# Patient Record
Sex: Female | Born: 1945 | Race: Black or African American | Hispanic: No | Marital: Single | Smoking: Current every day smoker
Health system: Southern US, Community
[De-identification: ages and names within clinical notes are randomized; demographics above are authoritative.]

## PROBLEM LIST (undated history)

## (undated) DIAGNOSIS — D649 Anemia, unspecified: Secondary | ICD-10-CM

## (undated) DIAGNOSIS — E119 Type 2 diabetes mellitus without complications: Secondary | ICD-10-CM

## (undated) DIAGNOSIS — I10 Essential (primary) hypertension: Secondary | ICD-10-CM

## (undated) HISTORY — PX: BACK SURGERY: SHX140

---

## 1998-02-17 ENCOUNTER — Emergency Department (HOSPITAL_COMMUNITY): Admission: EM | Admit: 1998-02-17 | Discharge: 1998-02-17 | Payer: Self-pay | Admitting: Emergency Medicine

## 1998-02-17 ENCOUNTER — Encounter: Payer: Self-pay | Admitting: Internal Medicine

## 1998-02-17 ENCOUNTER — Encounter: Payer: Self-pay | Admitting: Emergency Medicine

## 1998-02-18 ENCOUNTER — Emergency Department (HOSPITAL_COMMUNITY): Admission: EM | Admit: 1998-02-18 | Discharge: 1998-02-18 | Payer: Self-pay | Admitting: Emergency Medicine

## 1998-02-18 ENCOUNTER — Encounter: Payer: Self-pay | Admitting: Emergency Medicine

## 1998-02-23 ENCOUNTER — Encounter: Payer: Self-pay | Admitting: Cardiovascular Disease

## 1998-02-23 ENCOUNTER — Inpatient Hospital Stay (HOSPITAL_COMMUNITY): Admission: EM | Admit: 1998-02-23 | Discharge: 1998-02-25 | Payer: Self-pay | Admitting: Emergency Medicine

## 1998-06-24 ENCOUNTER — Emergency Department (HOSPITAL_COMMUNITY): Admission: EM | Admit: 1998-06-24 | Discharge: 1998-06-24 | Payer: Self-pay | Admitting: Emergency Medicine

## 1998-09-01 ENCOUNTER — Emergency Department (HOSPITAL_COMMUNITY): Admission: EM | Admit: 1998-09-01 | Discharge: 1998-09-01 | Payer: Self-pay | Admitting: Emergency Medicine

## 1998-10-24 ENCOUNTER — Encounter: Payer: Self-pay | Admitting: Emergency Medicine

## 1998-10-24 ENCOUNTER — Emergency Department (HOSPITAL_COMMUNITY): Admission: EM | Admit: 1998-10-24 | Discharge: 1998-10-24 | Payer: Self-pay | Admitting: Emergency Medicine

## 1998-12-25 ENCOUNTER — Encounter: Payer: Self-pay | Admitting: Emergency Medicine

## 1998-12-25 ENCOUNTER — Inpatient Hospital Stay (HOSPITAL_COMMUNITY): Admission: EM | Admit: 1998-12-25 | Discharge: 1998-12-28 | Payer: Self-pay | Admitting: Emergency Medicine

## 1999-11-14 ENCOUNTER — Emergency Department (HOSPITAL_COMMUNITY): Admission: EM | Admit: 1999-11-14 | Discharge: 1999-11-14 | Payer: Self-pay | Admitting: Emergency Medicine

## 1999-12-12 ENCOUNTER — Encounter: Admission: RE | Admit: 1999-12-12 | Discharge: 1999-12-12 | Payer: Self-pay | Admitting: Hematology and Oncology

## 1999-12-29 ENCOUNTER — Encounter: Admission: RE | Admit: 1999-12-29 | Discharge: 2000-03-28 | Payer: Self-pay | Admitting: Internal Medicine

## 2000-02-16 ENCOUNTER — Encounter: Admission: RE | Admit: 2000-02-16 | Discharge: 2000-02-16 | Payer: Self-pay

## 2000-02-17 ENCOUNTER — Emergency Department (HOSPITAL_COMMUNITY): Admission: EM | Admit: 2000-02-17 | Discharge: 2000-02-17 | Payer: Self-pay | Admitting: *Deleted

## 2000-03-04 ENCOUNTER — Emergency Department (HOSPITAL_COMMUNITY): Admission: EM | Admit: 2000-03-04 | Discharge: 2000-03-04 | Payer: Self-pay | Admitting: Emergency Medicine

## 2000-03-05 ENCOUNTER — Ambulatory Visit (HOSPITAL_COMMUNITY): Admission: RE | Admit: 2000-03-05 | Discharge: 2000-03-05 | Payer: Self-pay | Admitting: Obstetrics

## 2000-04-19 ENCOUNTER — Encounter: Admission: RE | Admit: 2000-04-19 | Discharge: 2000-04-19 | Payer: Self-pay

## 2000-04-23 ENCOUNTER — Encounter: Payer: Self-pay | Admitting: Internal Medicine

## 2000-04-23 ENCOUNTER — Encounter: Payer: Self-pay | Admitting: Emergency Medicine

## 2000-04-23 ENCOUNTER — Inpatient Hospital Stay (HOSPITAL_COMMUNITY): Admission: EM | Admit: 2000-04-23 | Discharge: 2000-04-24 | Payer: Self-pay | Admitting: Emergency Medicine

## 2000-05-08 ENCOUNTER — Encounter: Admission: RE | Admit: 2000-05-08 | Discharge: 2000-05-08 | Payer: Self-pay

## 2000-05-31 ENCOUNTER — Emergency Department (HOSPITAL_COMMUNITY): Admission: EM | Admit: 2000-05-31 | Discharge: 2000-05-31 | Payer: Self-pay | Admitting: Emergency Medicine

## 2000-06-04 ENCOUNTER — Emergency Department (HOSPITAL_COMMUNITY): Admission: EM | Admit: 2000-06-04 | Discharge: 2000-06-04 | Payer: Self-pay | Admitting: Emergency Medicine

## 2000-06-07 ENCOUNTER — Ambulatory Visit (HOSPITAL_COMMUNITY): Admission: RE | Admit: 2000-06-07 | Discharge: 2000-06-07 | Payer: Self-pay | Admitting: Emergency Medicine

## 2000-06-08 ENCOUNTER — Emergency Department (HOSPITAL_COMMUNITY): Admission: EM | Admit: 2000-06-08 | Discharge: 2000-06-08 | Payer: Self-pay | Admitting: Emergency Medicine

## 2000-06-11 ENCOUNTER — Emergency Department (HOSPITAL_COMMUNITY): Admission: EM | Admit: 2000-06-11 | Discharge: 2000-06-11 | Payer: Self-pay | Admitting: Emergency Medicine

## 2000-06-11 ENCOUNTER — Encounter: Admission: RE | Admit: 2000-06-11 | Discharge: 2000-06-11 | Payer: Self-pay | Admitting: Internal Medicine

## 2000-06-21 ENCOUNTER — Encounter: Admission: RE | Admit: 2000-06-21 | Discharge: 2000-06-21 | Payer: Self-pay | Admitting: Internal Medicine

## 2000-06-22 ENCOUNTER — Ambulatory Visit (HOSPITAL_COMMUNITY): Admission: RE | Admit: 2000-06-22 | Discharge: 2000-06-22 | Payer: Self-pay | Admitting: *Deleted

## 2000-06-25 ENCOUNTER — Encounter: Admission: RE | Admit: 2000-06-25 | Discharge: 2000-06-25 | Payer: Self-pay | Admitting: Internal Medicine

## 2000-07-19 ENCOUNTER — Encounter: Admission: RE | Admit: 2000-07-19 | Discharge: 2000-07-19 | Payer: Self-pay | Admitting: Internal Medicine

## 2000-10-22 ENCOUNTER — Encounter: Admission: RE | Admit: 2000-10-22 | Discharge: 2000-10-22 | Payer: Self-pay | Admitting: Internal Medicine

## 2000-11-08 ENCOUNTER — Emergency Department (HOSPITAL_COMMUNITY): Admission: EM | Admit: 2000-11-08 | Discharge: 2000-11-08 | Payer: Self-pay | Admitting: Emergency Medicine

## 2000-11-08 ENCOUNTER — Encounter: Payer: Self-pay | Admitting: Emergency Medicine

## 2000-12-24 ENCOUNTER — Encounter: Admission: RE | Admit: 2000-12-24 | Discharge: 2000-12-24 | Payer: Self-pay | Admitting: Internal Medicine

## 2001-03-10 ENCOUNTER — Ambulatory Visit (HOSPITAL_COMMUNITY): Admission: RE | Admit: 2001-03-10 | Discharge: 2001-03-10 | Payer: Self-pay | Admitting: Obstetrics

## 2001-03-10 ENCOUNTER — Encounter: Payer: Self-pay | Admitting: Obstetrics

## 2001-07-17 ENCOUNTER — Encounter: Payer: Self-pay | Admitting: Emergency Medicine

## 2001-07-17 ENCOUNTER — Emergency Department (HOSPITAL_COMMUNITY): Admission: EM | Admit: 2001-07-17 | Discharge: 2001-07-17 | Payer: Self-pay

## 2001-08-05 ENCOUNTER — Emergency Department (HOSPITAL_COMMUNITY): Admission: EM | Admit: 2001-08-05 | Discharge: 2001-08-05 | Payer: Self-pay | Admitting: Emergency Medicine

## 2001-08-26 ENCOUNTER — Encounter: Admission: RE | Admit: 2001-08-26 | Discharge: 2001-08-26 | Payer: Self-pay | Admitting: Internal Medicine

## 2001-09-09 ENCOUNTER — Encounter: Admission: RE | Admit: 2001-09-09 | Discharge: 2001-09-09 | Payer: Self-pay | Admitting: Internal Medicine

## 2001-09-15 ENCOUNTER — Encounter: Admission: RE | Admit: 2001-09-15 | Discharge: 2001-09-15 | Payer: Self-pay | Admitting: Internal Medicine

## 2001-10-15 ENCOUNTER — Encounter: Admission: RE | Admit: 2001-10-15 | Discharge: 2001-10-15 | Payer: Self-pay | Admitting: Internal Medicine

## 2001-10-17 ENCOUNTER — Emergency Department (HOSPITAL_COMMUNITY): Admission: EM | Admit: 2001-10-17 | Discharge: 2001-10-17 | Payer: Self-pay | Admitting: Emergency Medicine

## 2001-11-14 ENCOUNTER — Encounter: Admission: RE | Admit: 2001-11-14 | Discharge: 2001-11-14 | Payer: Self-pay | Admitting: Internal Medicine

## 2001-12-23 ENCOUNTER — Ambulatory Visit (HOSPITAL_COMMUNITY): Admission: RE | Admit: 2001-12-23 | Discharge: 2001-12-23 | Payer: Self-pay | Admitting: Internal Medicine

## 2001-12-25 ENCOUNTER — Emergency Department (HOSPITAL_COMMUNITY): Admission: EM | Admit: 2001-12-25 | Discharge: 2001-12-25 | Payer: Self-pay | Admitting: Emergency Medicine

## 2001-12-25 ENCOUNTER — Encounter: Payer: Self-pay | Admitting: Emergency Medicine

## 2001-12-26 ENCOUNTER — Ambulatory Visit (HOSPITAL_COMMUNITY): Admission: RE | Admit: 2001-12-26 | Discharge: 2001-12-26 | Payer: Self-pay | Admitting: Physician Assistant

## 2002-03-03 ENCOUNTER — Emergency Department (HOSPITAL_COMMUNITY): Admission: EM | Admit: 2002-03-03 | Discharge: 2002-03-03 | Payer: Self-pay | Admitting: Emergency Medicine

## 2002-03-06 ENCOUNTER — Emergency Department (HOSPITAL_COMMUNITY): Admission: EM | Admit: 2002-03-06 | Discharge: 2002-03-06 | Payer: Self-pay | Admitting: Emergency Medicine

## 2002-03-11 ENCOUNTER — Encounter: Payer: Self-pay | Admitting: Obstetrics

## 2002-03-11 ENCOUNTER — Ambulatory Visit (HOSPITAL_COMMUNITY): Admission: RE | Admit: 2002-03-11 | Discharge: 2002-03-11 | Payer: Self-pay | Admitting: Obstetrics

## 2002-03-24 ENCOUNTER — Encounter: Admission: RE | Admit: 2002-03-24 | Discharge: 2002-03-24 | Payer: Self-pay | Admitting: Internal Medicine

## 2002-04-06 ENCOUNTER — Emergency Department (HOSPITAL_COMMUNITY): Admission: EM | Admit: 2002-04-06 | Discharge: 2002-04-06 | Payer: Self-pay

## 2002-06-10 ENCOUNTER — Emergency Department (HOSPITAL_COMMUNITY): Admission: EM | Admit: 2002-06-10 | Discharge: 2002-06-10 | Payer: Self-pay | Admitting: Emergency Medicine

## 2002-07-03 ENCOUNTER — Encounter: Admission: RE | Admit: 2002-07-03 | Discharge: 2002-07-03 | Payer: Self-pay | Admitting: Internal Medicine

## 2002-07-03 ENCOUNTER — Ambulatory Visit (HOSPITAL_COMMUNITY): Admission: RE | Admit: 2002-07-03 | Discharge: 2002-07-03 | Payer: Self-pay | Admitting: Internal Medicine

## 2002-07-25 ENCOUNTER — Encounter: Payer: Self-pay | Admitting: Emergency Medicine

## 2002-07-25 ENCOUNTER — Emergency Department (HOSPITAL_COMMUNITY): Admission: EM | Admit: 2002-07-25 | Discharge: 2002-07-25 | Payer: Self-pay | Admitting: Emergency Medicine

## 2002-08-07 ENCOUNTER — Encounter: Payer: Self-pay | Admitting: Emergency Medicine

## 2002-08-07 ENCOUNTER — Emergency Department (HOSPITAL_COMMUNITY): Admission: EM | Admit: 2002-08-07 | Discharge: 2002-08-07 | Payer: Self-pay | Admitting: Emergency Medicine

## 2002-10-15 ENCOUNTER — Encounter: Admission: RE | Admit: 2002-10-15 | Discharge: 2002-10-15 | Payer: Self-pay | Admitting: Internal Medicine

## 2002-11-02 ENCOUNTER — Emergency Department (HOSPITAL_COMMUNITY): Admission: AD | Admit: 2002-11-02 | Discharge: 2002-11-02 | Payer: Self-pay | Admitting: Family Medicine

## 2002-11-04 ENCOUNTER — Emergency Department (HOSPITAL_COMMUNITY): Admission: AD | Admit: 2002-11-04 | Discharge: 2002-11-04 | Payer: Self-pay | Admitting: Family Medicine

## 2002-11-25 ENCOUNTER — Encounter: Admission: RE | Admit: 2002-11-25 | Discharge: 2002-11-25 | Payer: Self-pay | Admitting: Internal Medicine

## 2003-01-13 ENCOUNTER — Emergency Department (HOSPITAL_COMMUNITY): Admission: EM | Admit: 2003-01-13 | Discharge: 2003-01-14 | Payer: Self-pay | Admitting: Emergency Medicine

## 2003-02-19 ENCOUNTER — Emergency Department (HOSPITAL_COMMUNITY): Admission: EM | Admit: 2003-02-19 | Discharge: 2003-02-19 | Payer: Self-pay | Admitting: Family Medicine

## 2003-02-24 ENCOUNTER — Encounter: Admission: RE | Admit: 2003-02-24 | Discharge: 2003-02-24 | Payer: Self-pay | Admitting: Internal Medicine

## 2003-03-03 ENCOUNTER — Encounter (INDEPENDENT_AMBULATORY_CARE_PROVIDER_SITE_OTHER): Payer: Self-pay | Admitting: *Deleted

## 2003-03-03 ENCOUNTER — Encounter: Admission: RE | Admit: 2003-03-03 | Discharge: 2003-03-03 | Payer: Self-pay | Admitting: Internal Medicine

## 2003-03-03 LAB — CONVERTED CEMR LAB: Pap Smear: NORMAL

## 2003-03-14 ENCOUNTER — Encounter (INDEPENDENT_AMBULATORY_CARE_PROVIDER_SITE_OTHER): Payer: Self-pay | Admitting: *Deleted

## 2003-03-18 ENCOUNTER — Encounter: Admission: RE | Admit: 2003-03-18 | Discharge: 2003-03-18 | Payer: Self-pay | Admitting: Internal Medicine

## 2003-03-24 ENCOUNTER — Ambulatory Visit (HOSPITAL_COMMUNITY): Admission: RE | Admit: 2003-03-24 | Discharge: 2003-03-24 | Payer: Self-pay | Admitting: Internal Medicine

## 2003-03-29 ENCOUNTER — Emergency Department (HOSPITAL_COMMUNITY): Admission: EM | Admit: 2003-03-29 | Discharge: 2003-03-29 | Payer: Self-pay | Admitting: Emergency Medicine

## 2003-04-18 ENCOUNTER — Emergency Department (HOSPITAL_COMMUNITY): Admission: EM | Admit: 2003-04-18 | Discharge: 2003-04-19 | Payer: Self-pay | Admitting: Emergency Medicine

## 2003-04-19 ENCOUNTER — Emergency Department (HOSPITAL_COMMUNITY): Admission: AD | Admit: 2003-04-19 | Discharge: 2003-04-19 | Payer: Self-pay | Admitting: Family Medicine

## 2003-06-25 ENCOUNTER — Encounter (INDEPENDENT_AMBULATORY_CARE_PROVIDER_SITE_OTHER): Payer: Self-pay | Admitting: *Deleted

## 2003-06-25 ENCOUNTER — Encounter: Admission: RE | Admit: 2003-06-25 | Discharge: 2003-06-25 | Payer: Self-pay | Admitting: Internal Medicine

## 2003-06-25 LAB — CONVERTED CEMR LAB
Hemoglobin: 11.8 g/dL
Platelets: 671 K/uL
WBC: 8.5 10*3/microliter

## 2003-09-21 ENCOUNTER — Other Ambulatory Visit: Admission: RE | Admit: 2003-09-21 | Discharge: 2003-09-21 | Payer: Self-pay | Admitting: Oncology

## 2003-12-06 ENCOUNTER — Ambulatory Visit: Payer: Self-pay | Admitting: Internal Medicine

## 2003-12-11 ENCOUNTER — Emergency Department (HOSPITAL_COMMUNITY): Admission: EM | Admit: 2003-12-11 | Discharge: 2003-12-11 | Payer: Self-pay | Admitting: Emergency Medicine

## 2003-12-29 ENCOUNTER — Ambulatory Visit (HOSPITAL_COMMUNITY): Admission: RE | Admit: 2003-12-29 | Discharge: 2003-12-29 | Payer: Self-pay | Admitting: Internal Medicine

## 2003-12-29 ENCOUNTER — Encounter (INDEPENDENT_AMBULATORY_CARE_PROVIDER_SITE_OTHER): Payer: Self-pay | Admitting: *Deleted

## 2004-01-31 ENCOUNTER — Ambulatory Visit: Payer: Self-pay | Admitting: Internal Medicine

## 2004-02-02 ENCOUNTER — Encounter (INDEPENDENT_AMBULATORY_CARE_PROVIDER_SITE_OTHER): Payer: Self-pay | Admitting: *Deleted

## 2004-02-21 ENCOUNTER — Emergency Department (HOSPITAL_COMMUNITY): Admission: EM | Admit: 2004-02-21 | Discharge: 2004-02-22 | Payer: Self-pay | Admitting: Emergency Medicine

## 2004-04-14 ENCOUNTER — Emergency Department (HOSPITAL_COMMUNITY): Admission: EM | Admit: 2004-04-14 | Discharge: 2004-04-14 | Payer: Self-pay | Admitting: Emergency Medicine

## 2004-06-09 ENCOUNTER — Encounter (INDEPENDENT_AMBULATORY_CARE_PROVIDER_SITE_OTHER): Payer: Self-pay | Admitting: *Deleted

## 2004-06-09 ENCOUNTER — Ambulatory Visit: Payer: Self-pay | Admitting: Internal Medicine

## 2004-06-09 LAB — CONVERTED CEMR LAB
Hemoglobin: 11.2 g/dL
Platelets: 646 10*3/uL
WBC: 9 10*3/uL

## 2004-06-16 ENCOUNTER — Ambulatory Visit: Payer: Self-pay | Admitting: Internal Medicine

## 2004-06-21 ENCOUNTER — Emergency Department (HOSPITAL_COMMUNITY): Admission: EM | Admit: 2004-06-21 | Discharge: 2004-06-22 | Payer: Self-pay | Admitting: Emergency Medicine

## 2004-07-28 ENCOUNTER — Ambulatory Visit: Payer: Self-pay | Admitting: Internal Medicine

## 2004-08-01 ENCOUNTER — Emergency Department (HOSPITAL_COMMUNITY): Admission: EM | Admit: 2004-08-01 | Discharge: 2004-08-01 | Payer: Self-pay | Admitting: Emergency Medicine

## 2004-08-23 ENCOUNTER — Encounter (INDEPENDENT_AMBULATORY_CARE_PROVIDER_SITE_OTHER): Payer: Self-pay | Admitting: *Deleted

## 2004-08-23 ENCOUNTER — Ambulatory Visit: Payer: Self-pay | Admitting: Internal Medicine

## 2004-08-23 LAB — CONVERTED CEMR LAB
Hemoglobin: 11.5 g/dL
Platelets: 675 10*3/uL
WBC: 8.5 10*3/uL

## 2004-09-05 ENCOUNTER — Ambulatory Visit: Payer: Self-pay | Admitting: Physical Medicine and Rehabilitation

## 2004-09-05 ENCOUNTER — Encounter
Admission: RE | Admit: 2004-09-05 | Discharge: 2004-12-04 | Payer: Self-pay | Admitting: Physical Medicine and Rehabilitation

## 2004-09-26 ENCOUNTER — Emergency Department (HOSPITAL_COMMUNITY): Admission: EM | Admit: 2004-09-26 | Discharge: 2004-09-27 | Payer: Self-pay | Admitting: *Deleted

## 2004-11-03 ENCOUNTER — Emergency Department (HOSPITAL_COMMUNITY): Admission: EM | Admit: 2004-11-03 | Discharge: 2004-11-03 | Payer: Self-pay | Admitting: Emergency Medicine

## 2005-01-05 ENCOUNTER — Emergency Department (HOSPITAL_COMMUNITY): Admission: EM | Admit: 2005-01-05 | Discharge: 2005-01-05 | Payer: Self-pay | Admitting: Emergency Medicine

## 2005-03-20 ENCOUNTER — Emergency Department (HOSPITAL_COMMUNITY): Admission: EM | Admit: 2005-03-20 | Discharge: 2005-03-20 | Payer: Self-pay | Admitting: Emergency Medicine

## 2005-11-13 ENCOUNTER — Encounter (INDEPENDENT_AMBULATORY_CARE_PROVIDER_SITE_OTHER): Payer: Self-pay | Admitting: *Deleted

## 2005-11-13 DIAGNOSIS — N951 Menopausal and female climacteric states: Secondary | ICD-10-CM | POA: Insufficient documentation

## 2005-11-13 DIAGNOSIS — Z9189 Other specified personal risk factors, not elsewhere classified: Secondary | ICD-10-CM | POA: Insufficient documentation

## 2005-11-13 DIAGNOSIS — D759 Disease of blood and blood-forming organs, unspecified: Secondary | ICD-10-CM | POA: Insufficient documentation

## 2005-11-13 DIAGNOSIS — F17200 Nicotine dependence, unspecified, uncomplicated: Secondary | ICD-10-CM | POA: Insufficient documentation

## 2005-11-13 DIAGNOSIS — I509 Heart failure, unspecified: Secondary | ICD-10-CM | POA: Insufficient documentation

## 2005-11-13 DIAGNOSIS — D649 Anemia, unspecified: Secondary | ICD-10-CM | POA: Insufficient documentation

## 2005-11-13 DIAGNOSIS — G5600 Carpal tunnel syndrome, unspecified upper limb: Secondary | ICD-10-CM | POA: Insufficient documentation

## 2005-11-13 DIAGNOSIS — E119 Type 2 diabetes mellitus without complications: Secondary | ICD-10-CM | POA: Insufficient documentation

## 2005-11-13 DIAGNOSIS — I10 Essential (primary) hypertension: Secondary | ICD-10-CM | POA: Insufficient documentation

## 2005-11-13 DIAGNOSIS — E785 Hyperlipidemia, unspecified: Secondary | ICD-10-CM | POA: Insufficient documentation

## 2006-04-05 IMAGING — CR DG CHEST 2V
2 series · 2 of 2 positions shown · non-contrast
Comparison: 04/19/2003.

CLINICAL DATA: Flank pain.  Spitting up blood.
 TWO VIEWS OF THE CHEST ? 12/11/2003:

[view not recorded (1 of 2)]
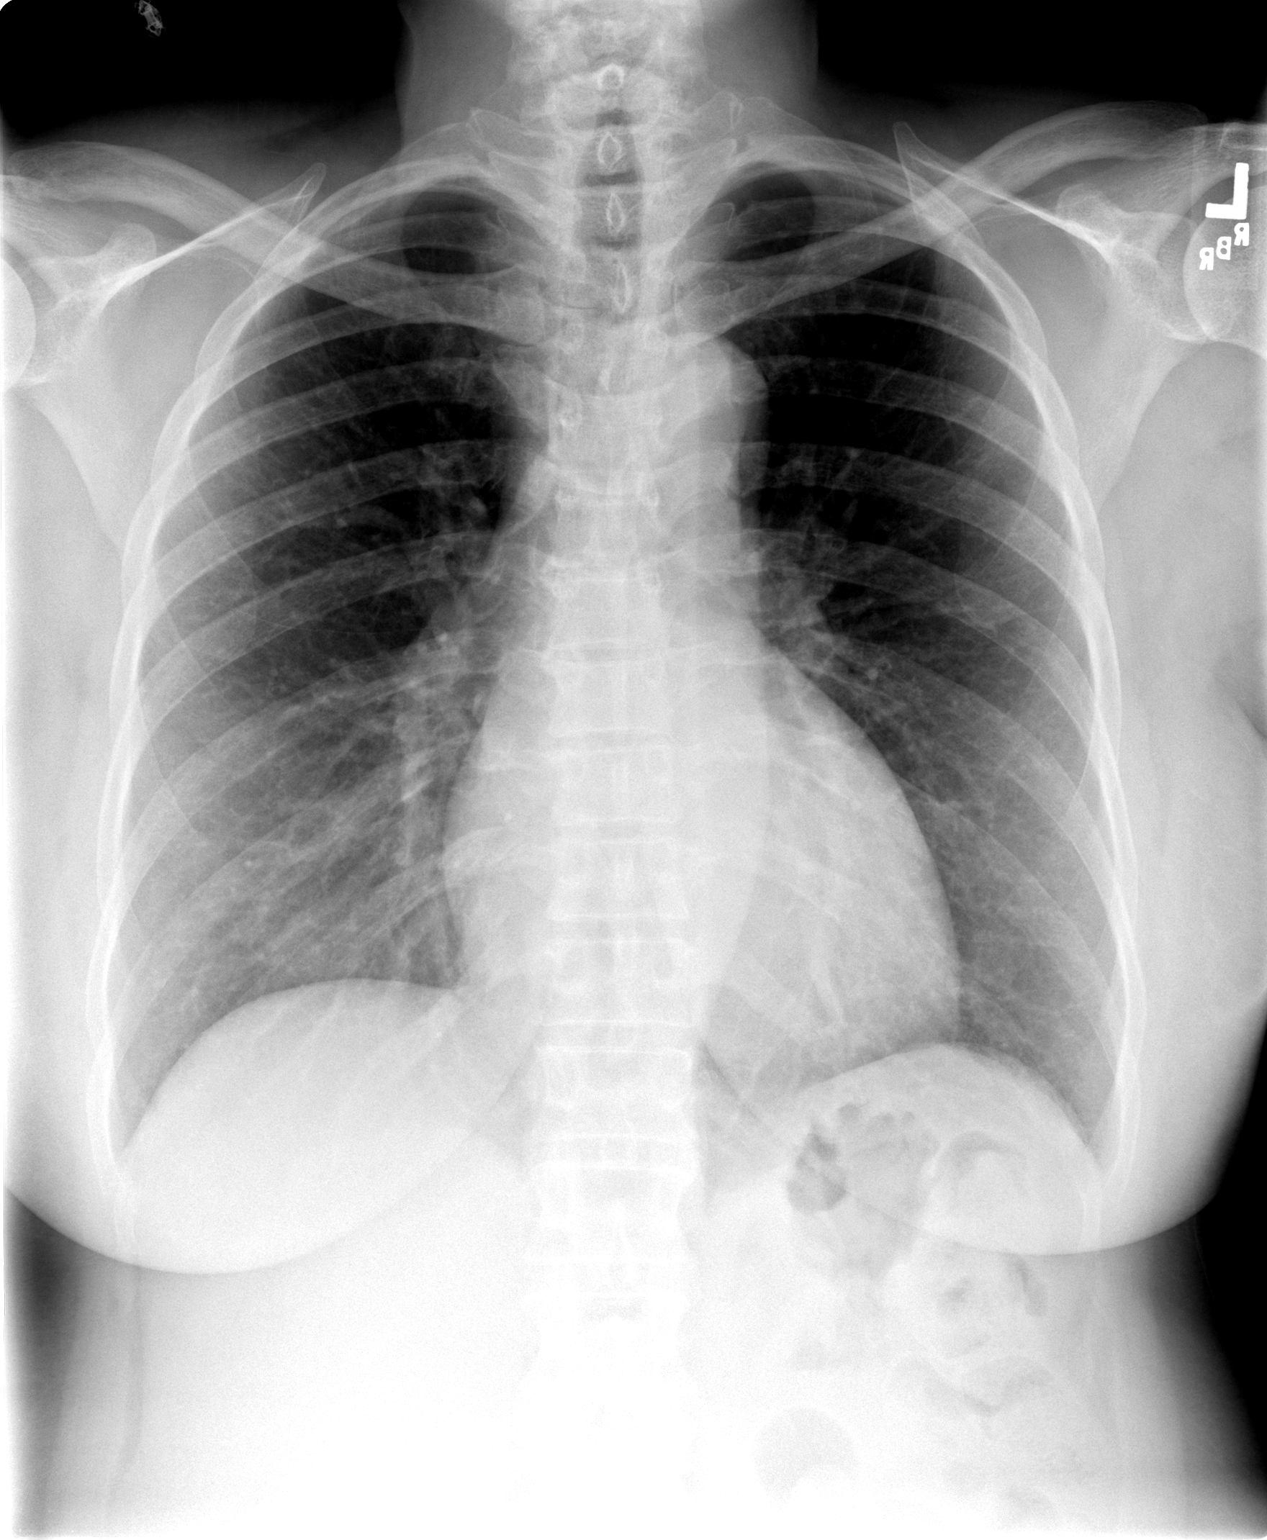

[view not recorded (2 of 2)]
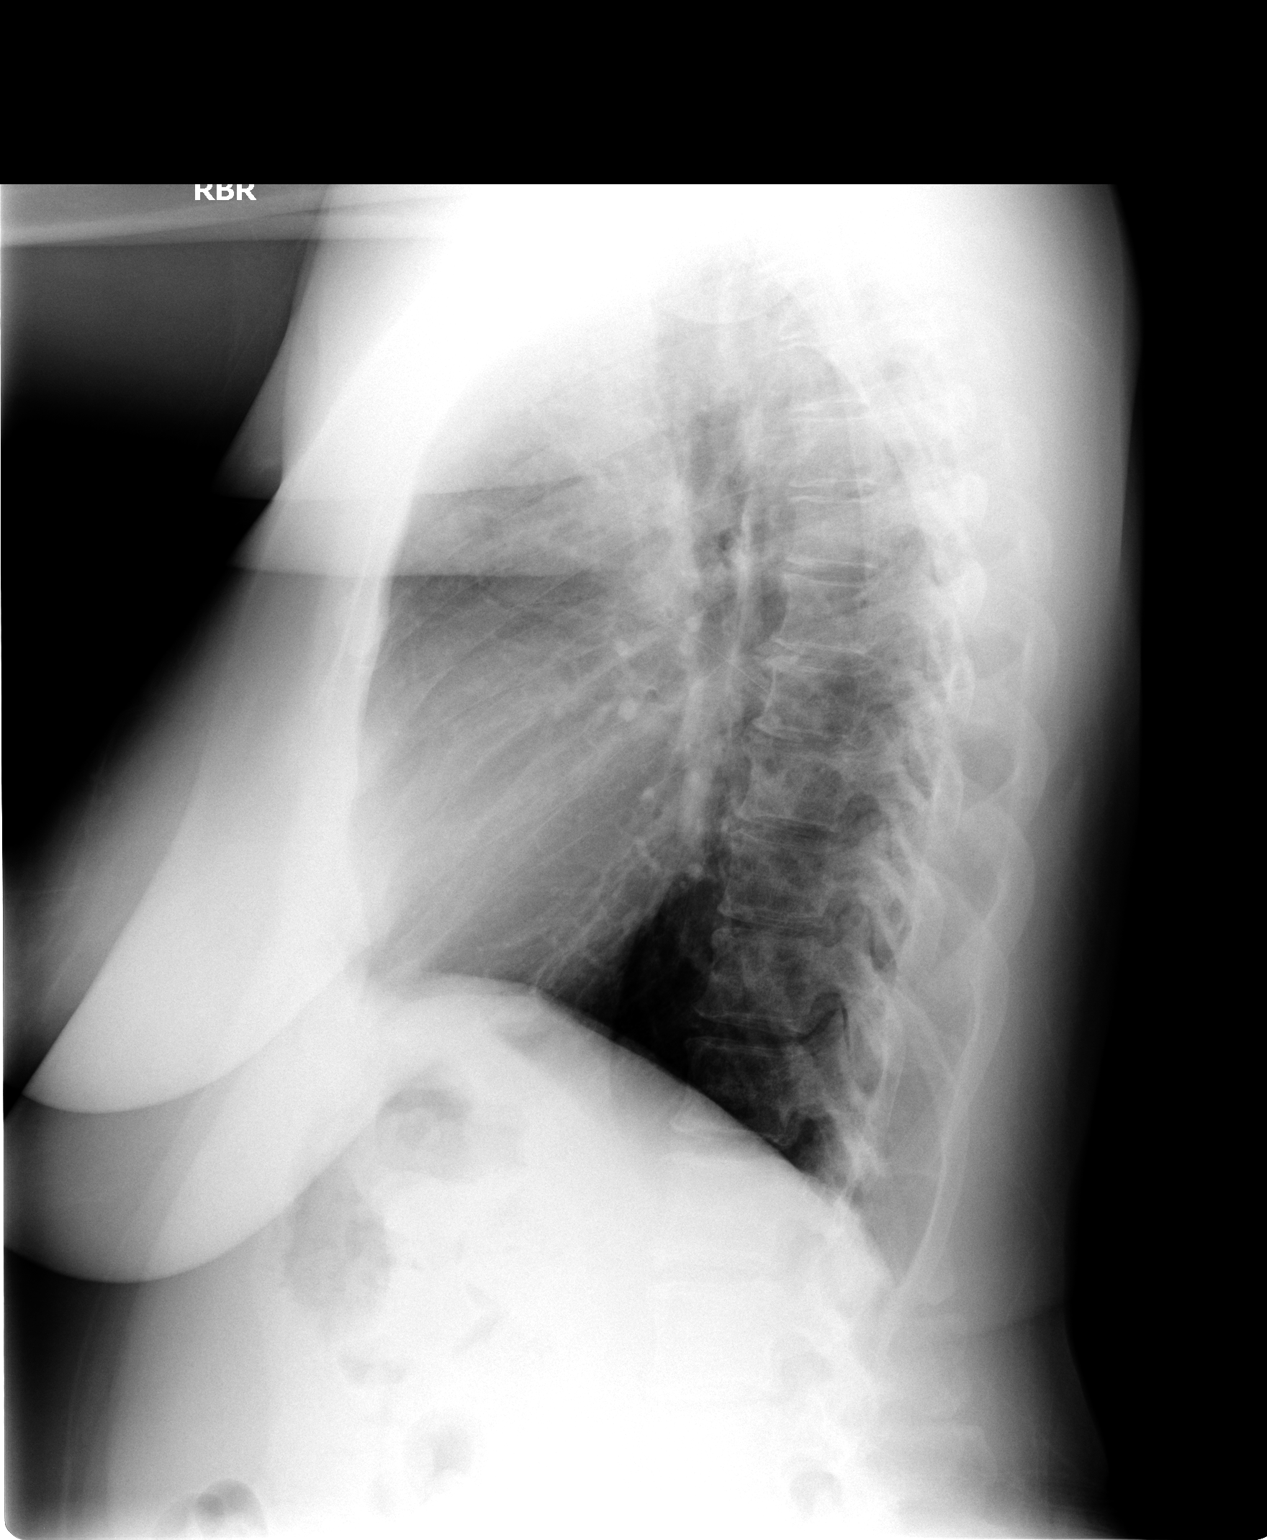

[2 of 2 positions shown; findings below may reference images not displayed]

FINDINGS: Two view chest shows no focal consolidation, edema or effusion.  Interstitial markings are coursing.  Cardiopericardial silhouette is at upper limits of normal.  The image bony structures are unremarkable.
IMPRESSION: No acute cardiopulmonary process.

## 2010-03-12 ENCOUNTER — Emergency Department (HOSPITAL_COMMUNITY)
Admission: EM | Admit: 2010-03-12 | Discharge: 2010-03-12 | Disposition: A | Discharge status: Home / Self Care | Payer: Medicare Other | Attending: Emergency Medicine | Admitting: Emergency Medicine

## 2010-03-12 DIAGNOSIS — X58XXXA Exposure to other specified factors, initial encounter: Secondary | ICD-10-CM | POA: Insufficient documentation

## 2010-03-12 DIAGNOSIS — I10 Essential (primary) hypertension: Secondary | ICD-10-CM | POA: Insufficient documentation

## 2010-03-12 DIAGNOSIS — E119 Type 2 diabetes mellitus without complications: Secondary | ICD-10-CM | POA: Insufficient documentation

## 2010-03-12 DIAGNOSIS — L500 Allergic urticaria: Secondary | ICD-10-CM | POA: Insufficient documentation

## 2010-03-12 DIAGNOSIS — T7840XA Allergy, unspecified, initial encounter: Secondary | ICD-10-CM | POA: Insufficient documentation

## 2012-05-28 ENCOUNTER — Emergency Department (HOSPITAL_COMMUNITY)
Admission: EM | Admit: 2012-05-28 | Discharge: 2012-05-28 | Disposition: A | Discharge status: Home / Self Care | Payer: PRIVATE HEALTH INSURANCE | Attending: Emergency Medicine | Admitting: Emergency Medicine

## 2012-05-28 ENCOUNTER — Encounter (HOSPITAL_COMMUNITY): Payer: Self-pay | Admitting: Emergency Medicine

## 2012-05-28 ENCOUNTER — Emergency Department (HOSPITAL_COMMUNITY): Payer: PRIVATE HEALTH INSURANCE

## 2012-05-28 DIAGNOSIS — I10 Essential (primary) hypertension: Secondary | ICD-10-CM | POA: Insufficient documentation

## 2012-05-28 DIAGNOSIS — R059 Cough, unspecified: Secondary | ICD-10-CM | POA: Insufficient documentation

## 2012-05-28 DIAGNOSIS — R071 Chest pain on breathing: Secondary | ICD-10-CM | POA: Insufficient documentation

## 2012-05-28 DIAGNOSIS — R51 Headache: Secondary | ICD-10-CM | POA: Insufficient documentation

## 2012-05-28 DIAGNOSIS — R0781 Pleurodynia: Secondary | ICD-10-CM

## 2012-05-28 DIAGNOSIS — R110 Nausea: Secondary | ICD-10-CM | POA: Insufficient documentation

## 2012-05-28 DIAGNOSIS — R05 Cough: Secondary | ICD-10-CM | POA: Insufficient documentation

## 2012-05-28 DIAGNOSIS — E119 Type 2 diabetes mellitus without complications: Secondary | ICD-10-CM | POA: Insufficient documentation

## 2012-05-28 DIAGNOSIS — R109 Unspecified abdominal pain: Secondary | ICD-10-CM | POA: Insufficient documentation

## 2012-05-28 HISTORY — DX: Essential (primary) hypertension: I10

## 2012-05-28 HISTORY — DX: Type 2 diabetes mellitus without complications: E11.9

## 2012-05-28 LAB — CBC WITH DIFFERENTIAL/PLATELET
Basophils Absolute: 0 10*3/uL (ref 0.0–0.1)
Basophils Relative: 0 % (ref 0–1)
Eosinophils Absolute: 0.2 10*3/uL (ref 0.0–0.7)
Eosinophils Relative: 3 % (ref 0–5)
HCT: 31.8 % — ABNORMAL LOW (ref 36.0–46.0)
Hemoglobin: 11 g/dL — ABNORMAL LOW (ref 12.0–15.0)
Lymphocytes Relative: 25 % (ref 12–46)
MCH: 30.5 pg (ref 26.0–34.0)
MCHC: 34.6 g/dL (ref 30.0–36.0)
MCV: 88.1 fL (ref 78.0–100.0)
Monocytes Absolute: 0.6 10*3/uL (ref 0.1–1.0)
Monocytes Relative: 7 % (ref 3–12)
Neutro Abs: 5.3 10*3/uL (ref 1.7–7.7)
Neutrophils Relative %: 65 % (ref 43–77)
RBC: 3.61 MIL/uL — ABNORMAL LOW (ref 3.87–5.11)
RDW: 12.7 % (ref 11.5–15.5)
WBC: 8.2 10*3/uL (ref 4.0–10.5)

## 2012-05-28 LAB — COMPREHENSIVE METABOLIC PANEL
AST: 15 U/L (ref 0–37)
Albumin: 4.1 g/dL (ref 3.5–5.2)
Alkaline Phosphatase: 90 U/L (ref 39–117)
BUN: 16 mg/dL (ref 6–23)
CO2: 27 mEq/L (ref 19–32)
Chloride: 99 mEq/L (ref 96–112)
Creatinine, Ser: 0.97 mg/dL (ref 0.50–1.10)
GFR calc non Af Amer: 60 mL/min — ABNORMAL LOW (ref >90)
Glucose, Bld: 128 mg/dL — ABNORMAL HIGH (ref 70–99)
Potassium: 3.7 mEq/L (ref 3.5–5.1)
Total Bilirubin: 0.2 mg/dL — ABNORMAL LOW (ref 0.3–1.2)
Total Protein: 7.9 g/dL (ref 6.0–8.3)

## 2012-05-28 LAB — URINALYSIS, ROUTINE W REFLEX MICROSCOPIC
Glucose, UA: NEGATIVE mg/dL
Hgb urine dipstick: NEGATIVE
Ketones, ur: NEGATIVE mg/dL
Leukocytes, UA: NEGATIVE
Nitrite: NEGATIVE
Protein, ur: NEGATIVE mg/dL
Specific Gravity, Urine: 1.013 (ref 1.005–1.030)
Urobilinogen, UA: 0.2 mg/dL (ref 0.0–1.0)
pH: 5 (ref 5.0–8.0)

## 2012-05-28 LAB — LIPASE, BLOOD: Lipase: 59 U/L (ref 11–59)

## 2012-05-28 LAB — ABO/RH: ABO/RH(D): A POS

## 2012-05-28 LAB — TYPE AND SCREEN: Antibody Screen: NEGATIVE

## 2012-05-28 MED ORDER — ONDANSETRON HCL 4 MG/2ML IJ SOLN
4.0000 mg | Freq: Once | INTRAMUSCULAR | Status: AC
  Administered 2012-05-28: 4 mg via INTRAVENOUS
  Filled 2012-05-28: qty 2

## 2012-05-28 MED ORDER — HYDROMORPHONE HCL PF 1 MG/ML IJ SOLN
1.0000 mg | Freq: Once | INTRAMUSCULAR | Status: AC
Start: 2012-05-28 — End: 2012-05-28
  Administered 2012-05-28: 1 mg via INTRAVENOUS
  Filled 2012-05-28: qty 1

## 2012-05-28 MED ORDER — SODIUM CHLORIDE 0.9 % IV BOLUS (SEPSIS)
1000.0000 mL | Freq: Once | INTRAVENOUS | Status: AC
  Administered 2012-05-28: 1000 mL via INTRAVENOUS

## 2012-05-28 NOTE — ED Notes (Signed)
Pt c/o rt sided flank pain x 2 days ago. C/o headache that started last night.  Denies vomiting or diarrhea.

## 2012-05-28 NOTE — ED Provider Notes (Signed)
History     CSN: [id number]  Arrival date & time 05/28/12  0911   First MD Initiated Contact with Patient 05/28/12 1000      Chief Complaint  Patient presents with  . Headache  . Flank Pain    (Consider location/radiation/quality/duration/timing/severity/associated sxs/prior treatment) Patient is a 67 y.o. female presenting with headaches and flank pain.  Headache Flank Pain Associated symptoms include headaches.   Pt with history of HTN and DM reports she has had moderate aching R flank/lower rib pain for the last 2-3 days, sharp, constant, worse with cough and deep breath. Has had productive cough recently, but denies any fever, SOB or CP. Denies abdominal pain but reports yesterday she began to have a gradually worsening, diffuse throbbing headache which was worse this AM. Reports some nausea, no vomiting, blurry vision, neck stiffness or fever. No history of similar headaches. Was not sudden in onset.   Past Medical History  Diagnosis Date  . Hypertension   . Diabetes mellitus without complication     Past Surgical History  Procedure Laterality Date  . Back surgery      No family history on file.  History  Substance Use Topics  . Smoking status: Current Every Day Smoker -- 0.50 packs/day    Types: Cigarettes  . Smokeless tobacco: Not on file  . Alcohol Use: No    OB History   Grav Para Term Preterm Abortions TAB SAB Ect Mult Living                  Review of Systems  Genitourinary: Positive for flank pain.  Neurological: Positive for headaches.   All other systems reviewed and are negative except as noted in HPI.   Allergies  Review of patient's allergies indicates not on file.  Home Medications  No current outpatient prescriptions on file.  Temp(Src) 98.5 F (36.9 C) (Oral)  Physical Exam  Nursing note and vitals reviewed. Constitutional: She is oriented to person, place, and time. She appears well-developed and well-nourished.  HENT:  Head:  Normocephalic and atraumatic.  Eyes: EOM are normal. Pupils are equal, round, and reactive to light.  Neck: Normal range of motion. Neck supple.  Cardiovascular: Normal rate, normal heart sounds and intact distal pulses.   Pulmonary/Chest: Effort normal and breath sounds normal. She exhibits tenderness (R lateral lower ribs).  Abdominal: Bowel sounds are normal. She exhibits no distension. There is no tenderness.  Musculoskeletal: Normal range of motion. She exhibits no edema and no tenderness.  Neurological: She is alert and oriented to person, place, and time. She has normal strength. She displays normal reflexes. No cranial nerve deficit or sensory deficit. Coordination normal.  Skin: Skin is warm and dry. No rash noted.  Psychiatric: She has a normal mood and affect.    ED Course  Procedures (including critical care time)  Labs Reviewed  CBC WITH DIFFERENTIAL - Abnormal; Notable for the following:    RBC 3.61 (*)    Hemoglobin 11.0 (*)    HCT 31.8 (*)    Platelets 478 (*)    All other components within normal limits  COMPREHENSIVE METABOLIC PANEL - Abnormal; Notable for the following:    Glucose, Bld 128 (*)    Total Bilirubin 0.2 (*)    GFR calc non Af Amer 60 (*)    GFR calc Af Amer 70 (*)    All other components within normal limits  LIPASE, BLOOD  URINALYSIS, ROUTINE W REFLEX MICROSCOPIC  TYPE AND SCREEN  ABO/RH   Dg Chest 2 View  05/28/2012   *RADIOLOGY REPORT*  Clinical Data: Cough.  Right lower rib pain.  CHEST - 2 VIEW  Comparison: 11/03/2004  Findings: Heart size and pulmonary vascularity are normal and the lungs are clear.  No effusions.  No osseous abnormality.  IMPRESSION: Normal exam.   Original Report Authenticated By: Francene Boyers, M.D.   Ct Head Wo Contrast  05/28/2012   *RADIOLOGY REPORT*  Clinical Data: Headache and nausea.  CT HEAD WITHOUT CONTRAST  Technique:  Contiguous axial images were obtained from the base of the skull through the vertex without  contrast.  Comparison: None.  Findings: There is no hemorrhage, infarction, mass lesion, or other abnormality.  Brain parenchyma is normal.  Osseous structures are normal.  IMPRESSION: Normal exam.   Original Report Authenticated By: Francene Boyers, M.D.     1. Rib pain   2. Headache       MDM  Labs and imaging reviewed. Hgb at baseline. Normal head CT and CXR. Suspect rib pain is from coughing. No mass or infiltrate. No concern for PE, CAD. Headache improved with pain medications. Doubt meningitis, SAH or other serious intracranial etiology. Plan discharge with close PCP follow up at her home in [place]. She has hydrocodone to take at home.         Charles B. Bernette Mayers, MD 05/28/12 1325

## 2016-09-21 ENCOUNTER — Emergency Department (HOSPITAL_COMMUNITY): Payer: Medicare Other

## 2016-09-21 ENCOUNTER — Emergency Department (HOSPITAL_COMMUNITY)
Admission: EM | Admit: 2016-09-21 | Discharge: 2016-09-21 | Disposition: A | Discharge status: Home / Self Care | Payer: Medicare Other | Attending: Emergency Medicine | Admitting: Emergency Medicine

## 2016-09-21 ENCOUNTER — Encounter (HOSPITAL_COMMUNITY): Payer: Self-pay | Admitting: Emergency Medicine

## 2016-09-21 DIAGNOSIS — I509 Heart failure, unspecified: Secondary | ICD-10-CM | POA: Diagnosis not present

## 2016-09-21 DIAGNOSIS — F17210 Nicotine dependence, cigarettes, uncomplicated: Secondary | ICD-10-CM | POA: Insufficient documentation

## 2016-09-21 DIAGNOSIS — R1084 Generalized abdominal pain: Secondary | ICD-10-CM | POA: Insufficient documentation

## 2016-09-21 DIAGNOSIS — E119 Type 2 diabetes mellitus without complications: Secondary | ICD-10-CM | POA: Diagnosis not present

## 2016-09-21 DIAGNOSIS — Z79899 Other long term (current) drug therapy: Secondary | ICD-10-CM | POA: Insufficient documentation

## 2016-09-21 DIAGNOSIS — Z794 Long term (current) use of insulin: Secondary | ICD-10-CM | POA: Diagnosis not present

## 2016-09-21 DIAGNOSIS — I110 Hypertensive heart disease with heart failure: Secondary | ICD-10-CM | POA: Insufficient documentation

## 2016-09-21 DIAGNOSIS — M791 Myalgia, unspecified site: Secondary | ICD-10-CM

## 2016-09-21 DIAGNOSIS — Z7982 Long term (current) use of aspirin: Secondary | ICD-10-CM | POA: Insufficient documentation

## 2016-09-21 HISTORY — DX: Anemia, unspecified: D64.9

## 2016-09-21 LAB — COMPREHENSIVE METABOLIC PANEL
ALK PHOS: 73 U/L (ref 38–126)
ALT: 16 U/L (ref 14–54)
ANION GAP: 9 (ref 5–15)
AST: 21 U/L (ref 15–41)
Albumin: 4.4 g/dL (ref 3.5–5.0)
BILIRUBIN TOTAL: 0.7 mg/dL (ref 0.3–1.2)
BUN: 26 mg/dL — ABNORMAL HIGH (ref 6–20)
CALCIUM: 9.6 mg/dL (ref 8.9–10.3)
CO2: 28 mmol/L (ref 22–32)
Chloride: 102 mmol/L (ref 101–111)
Creatinine, Ser: 1.37 mg/dL — ABNORMAL HIGH (ref 0.44–1.00)
GFR, EST AFRICAN AMERICAN: 44 mL/min — AB (ref >60)
GFR, EST NON AFRICAN AMERICAN: 38 mL/min — AB (ref >60)
Glucose, Bld: 103 mg/dL — ABNORMAL HIGH (ref 65–99)
Potassium: 4.1 mmol/L (ref 3.5–5.1)
SODIUM: 139 mmol/L (ref 135–145)
TOTAL PROTEIN: 8 g/dL (ref 6.5–8.1)

## 2016-09-21 LAB — URINALYSIS, ROUTINE W REFLEX MICROSCOPIC
Bilirubin Urine: NEGATIVE
GLUCOSE, UA: NEGATIVE mg/dL
HGB URINE DIPSTICK: NEGATIVE
Ketones, ur: NEGATIVE mg/dL
NITRITE: NEGATIVE
PH: 5 (ref 5.0–8.0)
PROTEIN: NEGATIVE mg/dL
Specific Gravity, Urine: 1.015 (ref 1.005–1.030)

## 2016-09-21 LAB — CBC WITH DIFFERENTIAL/PLATELET
BASOS PCT: 0 %
Basophils Absolute: 0 10*3/uL (ref 0.0–0.1)
EOS ABS: 0.1 10*3/uL (ref 0.0–0.7)
Eosinophils Relative: 1 %
HCT: 33.9 % — ABNORMAL LOW (ref 36.0–46.0)
HEMOGLOBIN: 11.6 g/dL — AB (ref 12.0–15.0)
Lymphocytes Relative: 28 %
Lymphs Abs: 2.2 10*3/uL (ref 0.7–4.0)
MCH: 31.3 pg (ref 26.0–34.0)
MCHC: 34.2 g/dL (ref 30.0–36.0)
MCV: 91.4 fL (ref 78.0–100.0)
Monocytes Absolute: 0.4 10*3/uL (ref 0.1–1.0)
Monocytes Relative: 5 %
NEUTROS PCT: 66 %
Neutro Abs: 5 10*3/uL (ref 1.7–7.7)
Platelets: 494 10*3/uL — ABNORMAL HIGH (ref 150–400)
RBC: 3.71 MIL/uL — AB (ref 3.87–5.11)
RDW: 12.6 % (ref 11.5–15.5)
WBC: 7.7 10*3/uL (ref 4.0–10.5)

## 2016-09-21 LAB — LIPASE, BLOOD: Lipase: 32 U/L (ref 11–51)

## 2016-09-21 MED ORDER — IOPAMIDOL (ISOVUE-300) INJECTION 61%
80.0000 mL | Freq: Once | INTRAVENOUS | Status: AC | PRN
  Administered 2016-09-21: 80 mL via INTRAVENOUS

## 2016-09-21 MED ORDER — SODIUM CHLORIDE 0.9 % IV BOLUS (SEPSIS)
500.0000 mL | Freq: Once | INTRAVENOUS | Status: AC
  Administered 2016-09-21: 500 mL via INTRAVENOUS

## 2016-09-21 MED ORDER — HYDROMORPHONE HCL 1 MG/ML IJ SOLN
0.5000 mg | Freq: Once | INTRAMUSCULAR | Status: AC
  Administered 2016-09-21: 0.5 mg via INTRAVENOUS
  Filled 2016-09-21: qty 1

## 2016-09-21 MED ORDER — ONDANSETRON HCL 4 MG/2ML IJ SOLN
4.0000 mg | Freq: Once | INTRAMUSCULAR | Status: AC
  Administered 2016-09-21: 4 mg via INTRAVENOUS
  Filled 2016-09-21: qty 2

## 2016-09-21 MED ORDER — HYDROCODONE-ACETAMINOPHEN 5-325 MG PO TABS: 1.0000 | ORAL_TABLET | Freq: Four times a day (QID) | ORAL | 0 refills | Status: DC | PRN

## 2016-09-21 MED ORDER — HYDROMORPHONE HCL 1 MG/ML IJ SOLN
1.0000 mg | Freq: Once | INTRAMUSCULAR | Status: AC
  Administered 2016-09-21: 1 mg via INTRAVENOUS
  Filled 2016-09-21: qty 1

## 2016-09-21 MED ORDER — SODIUM CHLORIDE 0.9 % IV SOLN
INTRAVENOUS | Status: DC
  Administered 2016-09-21: 13:00:00 via INTRAVENOUS

## 2016-09-21 NOTE — ED Notes (Signed)
Pt ambulatory to restroom. Pt given specimen cup to obtain urine sample.

## 2016-09-21 NOTE — ED Triage Notes (Signed)
Pt reports generalized body aches, cough, and intermittent fever for last several days. Pt also reports is currently being treated for anemia. nad noted.

## 2016-09-21 NOTE — ED Notes (Signed)
Advised pt not to drive after being given Dilaudid. Pt verbalized understanding

## 2016-09-21 NOTE — Discharge Instructions (Signed)
Extensive workup without any acute findings to include labs urinalysis CT of abdomen and pelvis and chest x-ray without any significant findings. Take pain medicine as needed for the bodyaches. And the abdominal pain. Return for any new or worse symptoms

## 2016-09-21 NOTE — ED Notes (Signed)
Pt to CT

## 2016-09-21 NOTE — ED Notes (Signed)
Pt complains of generalized aches everywhere.
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
## 2016-09-21 NOTE — ED Notes (Signed)
Pt states she took a laxative yesterday with no significant relief

## 2018-08-02 ENCOUNTER — Emergency Department (HOSPITAL_COMMUNITY): Payer: Medicare Other

## 2018-08-02 ENCOUNTER — Other Ambulatory Visit: Payer: Self-pay

## 2018-08-02 ENCOUNTER — Encounter (HOSPITAL_COMMUNITY): Payer: Self-pay | Admitting: Emergency Medicine

## 2018-08-02 ENCOUNTER — Emergency Department (HOSPITAL_COMMUNITY)
Admission: EM | Admit: 2018-08-02 | Discharge: 2018-08-02 | Disposition: A | Discharge status: Home / Self Care | Payer: Medicare Other | Attending: Emergency Medicine | Admitting: Emergency Medicine

## 2018-08-02 DIAGNOSIS — I10 Essential (primary) hypertension: Secondary | ICD-10-CM | POA: Diagnosis not present

## 2018-08-02 DIAGNOSIS — F17210 Nicotine dependence, cigarettes, uncomplicated: Secondary | ICD-10-CM | POA: Insufficient documentation

## 2018-08-02 DIAGNOSIS — E119 Type 2 diabetes mellitus without complications: Secondary | ICD-10-CM | POA: Insufficient documentation

## 2018-08-02 DIAGNOSIS — R1011 Right upper quadrant pain: Secondary | ICD-10-CM | POA: Insufficient documentation

## 2018-08-02 DIAGNOSIS — Z794 Long term (current) use of insulin: Secondary | ICD-10-CM | POA: Diagnosis not present

## 2018-08-02 DIAGNOSIS — R1013 Epigastric pain: Secondary | ICD-10-CM

## 2018-08-02 DIAGNOSIS — J45909 Unspecified asthma, uncomplicated: Secondary | ICD-10-CM | POA: Insufficient documentation

## 2018-08-02 DIAGNOSIS — Z7982 Long term (current) use of aspirin: Secondary | ICD-10-CM | POA: Diagnosis not present

## 2018-08-02 DIAGNOSIS — Z79899 Other long term (current) drug therapy: Secondary | ICD-10-CM | POA: Insufficient documentation

## 2018-08-02 LAB — COMPREHENSIVE METABOLIC PANEL
ALT: 18 U/L (ref 0–44)
AST: 24 U/L (ref 15–41)
Albumin: 4.1 g/dL (ref 3.5–5.0)
Alkaline Phosphatase: 78 U/L (ref 38–126)
Anion gap: 12 (ref 5–15)
BUN: 23 mg/dL (ref 8–23)
CO2: 23 mmol/L (ref 22–32)
Calcium: 9.7 mg/dL (ref 8.9–10.3)
Chloride: 100 mmol/L (ref 98–111)
Creatinine, Ser: 1.29 mg/dL — ABNORMAL HIGH (ref 0.44–1.00)
GFR calc Af Amer: 48 mL/min — ABNORMAL LOW (ref >60)
GFR calc non Af Amer: 42 mL/min — ABNORMAL LOW (ref >60)
Glucose, Bld: 212 mg/dL — ABNORMAL HIGH (ref 70–99)
Potassium: 3.9 mmol/L (ref 3.5–5.1)
Sodium: 135 mmol/L (ref 135–145)
Total Bilirubin: 0.5 mg/dL (ref 0.3–1.2)
Total Protein: 7.7 g/dL (ref 6.5–8.1)

## 2018-08-02 LAB — URINALYSIS, ROUTINE W REFLEX MICROSCOPIC
Bilirubin Urine: NEGATIVE
Glucose, UA: 50 mg/dL — AB
Hgb urine dipstick: NEGATIVE
Ketones, ur: NEGATIVE mg/dL
Nitrite: NEGATIVE
Protein, ur: 100 mg/dL — AB
Specific Gravity, Urine: 1.017 (ref 1.005–1.030)
pH: 5 (ref 5.0–8.0)

## 2018-08-02 LAB — CBC
HCT: 31.9 % — ABNORMAL LOW (ref 36.0–46.0)
Hemoglobin: 10.4 g/dL — ABNORMAL LOW (ref 12.0–15.0)
MCH: 30.1 pg (ref 26.0–34.0)
MCHC: 32.6 g/dL (ref 30.0–36.0)
MCV: 92.5 fL (ref 80.0–100.0)
Platelets: 651 10*3/uL — ABNORMAL HIGH (ref 150–400)
RBC: 3.45 MIL/uL — ABNORMAL LOW (ref 3.87–5.11)
RDW: 12.2 % (ref 11.5–15.5)
WBC: 9.7 10*3/uL (ref 4.0–10.5)
nRBC: 0 % (ref 0.0–0.2)

## 2018-08-02 LAB — LIPASE, BLOOD: Lipase: 117 U/L — ABNORMAL HIGH (ref 11–51)

## 2018-08-02 LAB — TROPONIN I (HIGH SENSITIVITY): Troponin I (High Sensitivity): 11 ng/L (ref <18)

## 2018-08-02 MED ORDER — ALUM & MAG HYDROXIDE-SIMETH 200-200-20 MG/5ML PO SUSP
30.0000 mL | Freq: Once | ORAL | Status: AC
  Administered 2018-08-02: 30 mL via ORAL
  Filled 2018-08-02: qty 30

## 2018-08-02 MED ORDER — METOCLOPRAMIDE HCL 5 MG/ML IJ SOLN
10.0000 mg | Freq: Once | INTRAMUSCULAR | Status: AC
  Administered 2018-08-02: 10 mg via INTRAVENOUS
  Filled 2018-08-02: qty 2

## 2018-08-02 MED ORDER — LIDOCAINE VISCOUS HCL 2 % MT SOLN
15.0000 mL | Freq: Once | OROMUCOSAL | Status: AC
  Administered 2018-08-02: 15 mL via ORAL
  Filled 2018-08-02: qty 15

## 2018-08-02 MED ORDER — ONDANSETRON 4 MG PO TBDP: 4.0000 mg | ORAL_TABLET | Freq: Three times a day (TID) | ORAL | 0 refills | Status: AC | PRN

## 2018-08-02 MED ORDER — OMEPRAZOLE 40 MG PO CPDR: 40.0000 mg | DELAYED_RELEASE_CAPSULE | Freq: Every day | ORAL | 0 refills | Status: AC | PRN

## 2018-08-02 MED ORDER — FENTANYL CITRATE (PF) 100 MCG/2ML IJ SOLN
50.0000 ug | Freq: Once | INTRAMUSCULAR | Status: AC
  Administered 2018-08-02: 50 ug via INTRAVENOUS
  Filled 2018-08-02: qty 2

## 2018-08-02 MED ORDER — SODIUM CHLORIDE 0.9 % IV BOLUS
1000.0000 mL | Freq: Once | INTRAVENOUS | Status: AC
  Administered 2018-08-02: 1000 mL via INTRAVENOUS

## 2018-08-02 MED ORDER — SODIUM CHLORIDE 0.9% FLUSH
3.0000 mL | Freq: Once | INTRAVENOUS | Status: AC
  Administered 2018-08-02: 3 mL via INTRAVENOUS

## 2018-08-02 NOTE — ED Provider Notes (Signed)
[facility] DEPT Provider Note   CSN: [id number] Arrival date & time: 08/02/18  0034    History   Chief Complaint Chief Complaint  Patient presents with   Abdominal Pain    HPI Barbara Greer is a 72 y.o. female.    73 year old female with a history of anemia, diabetes, hypertension presents to the emergency department for complaints of epigastric abdominal pain.  Symptoms began around 1900 2 days ago.  They have been coming and going without known modifying factors.  Pain does not radiate and is not made worse with exertion.  She has had some anorexia with her pain.  Initially felt that her pain may be secondary to constipation so she took a laxative.  Had a bowel movement on Thursday evening and Friday morning, but no additional bowel movements since this time.  She has been voiding normally without any associated nausea, vomiting, shortness of breath, diaphoresis/clamminess, lightheadedness, fever.  No history of abdominal surgeries or sick contacts.  The history is provided by the patient. No language interpreter was used.  Abdominal Pain   Past Medical History:  Diagnosis Date   Anemia    Diabetes mellitus without complication ([place])    Hypertension     Patient Active Problem List   Diagnosis Date Noted   DIABETES MELLITUS, TYPE II 11/13/2005   HYPERLIPIDEMIA 11/13/2005   ANEMIA, NORMOCYTIC 11/13/2005   THROMBOCYTOSIS 11/13/2005   TOBACCO USER 11/13/2005   SYNDROME, CARPAL TUNNEL 11/13/2005   HYPERTENSION 11/13/2005   CONGESTIVE HEART FAILURE 11/13/2005   POSTMENOPAUSAL STATUS 11/13/2005   COCAINE ABUSE, HX OF 11/13/2005    Past Surgical History:  Procedure Laterality Date   BACK SURGERY       OB History   No obstetric history on file.      Home Medications    Prior to Admission medications   Medication Sig Start Date End Date Taking? Authorizing Provider  albuterol (VENTOLIN HFA) 108 (90 Base) MCG/ACT inhaler  Inhale 2 puffs into the lungs every 4 (four) hours as needed for wheezing.  05/31/17  Yes [provider]  amoxicillin (AMOXIL) 500 MG capsule Take 500 mg by mouth 3 (three) times daily.  11/12/17  Yes [provider]  aspirin EC 81 MG tablet Take 81 mg by mouth daily.   Yes [provider]  atorvastatin (LIPITOR) 10 MG tablet Take 10 mg by mouth daily.  06/22/18  Yes [provider]  cetirizine (ZYRTEC) 10 MG tablet Take 10 mg by mouth daily.  03/20/18 03/20/19 Yes [provider]  gabapentin (NEURONTIN) 300 MG capsule Take 600 mg by mouth at bedtime.  01/22/18  Yes [provider]  HYDROcodone-acetaminophen (NORCO) 10-325 MG tablet Take 1 tablet by mouth every 6 (six) hours as needed for moderate pain.  03/28/10 08/15/18 Yes [provider]  Insulin Lispro Prot & Lispro (HUMALOG 75/25 MIX) (75-25) 100 UNIT/ML Kwikpen Inject 50 Units into the skin 2 (two) times a day.  07/28/18  Yes [provider]  lisinopril-hydrochlorothiazide (PRINZIDE,ZESTORETIC) 20-12.5 MG per tablet Take 1 tablet by mouth 2 (two) times daily. 03/03/12  Yes [provider]  metFORMIN (GLUCOPHAGE-XR) 500 MG 24 hr tablet Take 500 mg by mouth daily with breakfast.  07/28/18 08/27/18 Yes [provider]  Multiple Vitamin (MULTIVITAMIN WITH MINERALS) TABS tablet Take 1 tablet by mouth daily.   Yes [provider]  Polyethyl Glycol-Propyl Glycol (SYSTANE ULTRA) 0.4-0.3 % SOLN Apply 1 drop to eye 3 (three) times daily  as needed (for irritation/spots due to sun exposure.).    Yes [provider]  HYDROcodone-acetaminophen (NORCO/VICODIN) 5-325 MG tablet Take 1-2 tablets by mouth every 6 (six) hours as needed. Patient not taking: Reported on 08/02/2018 09/21/16   Vanetta MuldersZackowski, Scott, MD  metFORMIN (GLUCOPHAGE) 850 MG tablet Take 850 mg by mouth 2 (two) times daily with a meal.    [provider]  omeprazole (PRILOSEC) 40 MG capsule Take 1  capsule (40 mg total) by mouth daily as needed (Heart burn). 08/02/18   Antony MaduraHumes, Timiya Howells, PA-C  ondansetron (ZOFRAN ODT) 4 MG disintegrating tablet Take 1 tablet (4 mg total) by mouth every 8 (eight) hours as needed for nausea or vomiting. 08/02/18   Antony MaduraHumes, Chenise Mulvihill, PA-C    Family History History reviewed. No pertinent family history.  Social History Social History   Tobacco Use   Smoking status: Current Every Day Smoker    Packs/day: 0.50    Types: Cigarettes   Smokeless tobacco: Never Used  Substance Use Topics   Alcohol use: No   Drug use: No     Allergies   Oxycodone and Tramadol   Review of Systems Review of Systems  Gastrointestinal: Positive for abdominal pain.  Ten systems reviewed and are negative for acute change, except as noted in the HPI.    Physical Exam Updated Vital Signs BP (!) 149/69    Pulse 71    Temp (!) 97.3 F (36.3 C) (Oral)    Resp 14    Ht 5\' 2"  (1.575 m)    Wt 88 kg    SpO2 98%    BMI 35.48 kg/m   Physical Exam Vitals signs and nursing note reviewed.  Constitutional:      General: She is not in acute distress.    Appearance: She is well-developed. She is not diaphoretic.     Comments: Nontoxic-appearing and in no distress  HENT:     Head: Normocephalic and atraumatic.  Eyes:     General: No scleral icterus.    Conjunctiva/sclera: Conjunctivae normal.  Neck:     Musculoskeletal: Normal range of motion.  Cardiovascular:     Rate and Rhythm: Normal rate and regular rhythm.     Pulses: Normal pulses.  Pulmonary:     Effort: Pulmonary effort is normal. No respiratory distress.     Comments: Respirations even and unlabored Abdominal:     Comments: Abdomen soft, obese.  There is focal tenderness in the epigastrium and mildly also in the right upper quadrant.  Negative Murphy sign.  No peritoneal signs or palpable masses.  Musculoskeletal: Normal range of motion.  Skin:    General: Skin is warm and dry.     Coloration: Skin is not pale.      Findings: No erythema or rash.  Neurological:     General: No focal deficit present.     Mental Status: She is alert and oriented to person, place, and time.     Coordination: Coordination normal.  Psychiatric:        Behavior: Behavior normal.      ED Treatments / Results  Labs (all labs ordered are listed, but only abnormal results are displayed) Labs Reviewed  LIPASE, BLOOD - Abnormal; Notable for the following components:      Result Value   Lipase 117 (*)    All other components within normal limits  COMPREHENSIVE METABOLIC PANEL - Abnormal; Notable for the following components:   Glucose, Bld 212 (*)    Creatinine,  Ser 1.29 (*)    GFR calc non Af Amer 42 (*)    GFR calc Af Amer 48 (*)    All other components within normal limits  CBC - Abnormal; Notable for the following components:   RBC 3.45 (*)    Hemoglobin 10.4 (*)    HCT 31.9 (*)    Platelets 651 (*)    All other components within normal limits  URINALYSIS, ROUTINE W REFLEX MICROSCOPIC - Abnormal; Notable for the following components:   Color, Urine AMBER (*)    APPearance CLOUDY (*)    Glucose, UA 50 (*)    Protein, ur 100 (*)    Leukocytes,Ua TRACE (*)    Bacteria, UA RARE (*)    All other components within normal limits  TROPONIN I (HIGH SENSITIVITY)    EKG EKG Interpretation  Date/Time:  Saturday August 02 2018 01:34:16 EDT Ventricular Rate:  77 PR Interval:    QRS Duration: 101 QT Interval:  379 QTC Calculation: 429 R Axis:   -24 Text Interpretation:  Sinus rhythm Borderline left axis deviation Borderline T abnormalities, diffuse leads No significant change since last tracing Confirmed by Drema Pryardama, Pedro [phone]) on 08/02/2018 1:41:07 AM   Radiology Dg Chest 2 View  Result Date: 08/02/2018 CLINICAL DATA:  Epigastric pain EXAM: CHEST - 2 VIEW COMPARISON:  09/21/2016 FINDINGS: The heart size and mediastinal contours are within normal limits. Both lungs are clear. The visualized skeletal structures  are unremarkable. IMPRESSION: No active cardiopulmonary disease. Electronically Signed   By: Deatra RobinsonKevin  Herman M.D.   On: 08/02/2018 02:38   [place] Abdomen Limited  Result Date: 08/02/2018 CLINICAL DATA:  Epigastric pain EXAM: ULTRASOUND ABDOMEN LIMITED RIGHT UPPER QUADRANT COMPARISON:  None. FINDINGS: Gallbladder: No gallstones or wall thickening visualized. No sonographic Murphy sign noted by sonographer. Common bile duct: Diameter: 0.3 cm Liver: Diffuse increased echogenicity with slightly heterogeneous liver. Appearance typically secondary to fatty infiltration. Fibrosis secondary consideration. No secondary findings of cirrhosis noted. No focal hepatic lesion or intrahepatic biliary duct dilatation. Portal vein is patent on color Doppler imaging with normal direction of blood flow towards the liver. IMPRESSION: 1. No acute sonographic abnormality. 2. Hepatic steatosis. Electronically Signed   By: Katherine Mantlehristopher  Green M.D.   On: 08/02/2018 02:25    Procedures Procedures (including critical care time)  Medications Ordered in ED Medications  sodium chloride flush (NS) 0.9 % injection 3 mL (3 mLs Intravenous Given 08/02/18 0301)  fentaNYL (SUBLIMAZE) injection 50 mcg (50 mcg Intravenous Given 08/02/18 0158)  metoCLOPramide (REGLAN) injection 10 mg (10 mg Intravenous Given 08/02/18 0159)  alum & mag hydroxide-simeth (MAALOX/MYLANTA) 200-200-20 MG/5ML suspension 30 mL (30 mLs Oral Given 08/02/18 0256)    And  lidocaine (XYLOCAINE) 2 % viscous mouth solution 15 mL (15 mLs Oral Given 08/02/18 0256)  sodium chloride 0.9 % bolus 1,000 mL (1,000 mLs Intravenous New Bag/Given 08/02/18 0255)    4:42 AM Patient's pain has improved.  Repeat abdominal exam also improved.  No peritoneal signs.   Initial Impression / Assessment and Plan / ED Course  I have reviewed the triage vital signs and the nursing notes.  Pertinent labs & imaging results that were available during my care of the patient were reviewed by me and  considered in my medical decision making (see chart for details).        73 year old female presents to the emergency department for evaluation of epigastric abdominal pain.  Initially thought that she may be constipated, but her pain did  not improve with a laxative.  She has not had any fevers or vomiting.  Noted to have some focal tenderness in her epigastrium and mildly in her right upper quadrant on initial assessment.  Imaging and blood work today is reassuring.  She has no leukocytosis.  Anemia and kidney function are relatively stable.  Lipase is mildly elevated at 117.  Given location of pain, suspect mild pancreatitis.  She has no evidence of gallstones or sludge.  Chest x-ray without acute cardiopulmonary abnormality.  Her symptoms are not consistent with ACS.  Denies any exertional component to her pain.  No associated shortness of breath, diaphoresis.  Denies any radiation to her back, neck, jaw.  Repeat abdominal exam is improved.  The patient states that she is feeling much better following IV medications.  We will continue with outpatient symptom control.  She has been encouraged to follow-up with her primary care doctor.  Return precautions discussed and provided. Patient discharged in stable condition with no unaddressed concerns.   Final Clinical Impressions(s) / ED Diagnoses   Final diagnoses:  Epigastric pain    ED Discharge Orders         Ordered    omeprazole (PRILOSEC) 40 MG capsule  Daily PRN     08/02/18 0436    ondansetron (ZOFRAN ODT) 4 MG disintegrating tablet  Every 8 hours PRN     08/02/18 0436           Antony MaduraHumes, Suzane Vanderweide, PA-C 08/02/18 0444    Nira Connardama, Pedro Eduardo, MD 08/04/18 [phone]

## 2018-08-02 NOTE — ED Notes (Signed)
Patient ambulated to restroom.

## 2018-08-02 NOTE — ED Triage Notes (Signed)
Patient is complaining of mid upper abdominal pain that started last night. Patient had a bowel movement tonight and is still having mid abdominal.

## 2018-08-02 NOTE — Discharge Instructions (Signed)
Your lipase was mildly elevated which indicates that you may have mild pancreatitis.  We recommend a clear liquid diet until your pain resolves.  You may take your hydrocodone as prescribed by your pain management provider.  Use Prilosec in addition for symptom control.  You may take Zofran as needed for nausea.  We recommend follow-up with your primary care doctor within the week for repeat assessment.  You may return to the ED for any new or concerning symptoms.

## 2018-08-02 NOTE — ED Notes (Signed)
Pt ambulatory to Rm 15; steady gait noted.

## 2019-03-30 ENCOUNTER — Encounter (HOSPITAL_COMMUNITY): Payer: Self-pay | Admitting: Emergency Medicine

## 2019-03-30 ENCOUNTER — Emergency Department (HOSPITAL_COMMUNITY)
Admission: EM | Admit: 2019-03-30 | Discharge: 2019-03-30 | Disposition: A | Discharge status: Home / Self Care | Payer: Medicare Other | Attending: Emergency Medicine | Admitting: Emergency Medicine

## 2019-03-30 ENCOUNTER — Other Ambulatory Visit: Payer: Self-pay

## 2019-03-30 DIAGNOSIS — Z7982 Long term (current) use of aspirin: Secondary | ICD-10-CM | POA: Insufficient documentation

## 2019-03-30 DIAGNOSIS — I110 Hypertensive heart disease with heart failure: Secondary | ICD-10-CM | POA: Diagnosis not present

## 2019-03-30 DIAGNOSIS — F17210 Nicotine dependence, cigarettes, uncomplicated: Secondary | ICD-10-CM | POA: Insufficient documentation

## 2019-03-30 DIAGNOSIS — R112 Nausea with vomiting, unspecified: Secondary | ICD-10-CM | POA: Diagnosis not present

## 2019-03-30 DIAGNOSIS — R110 Nausea: Secondary | ICD-10-CM | POA: Diagnosis present

## 2019-03-30 DIAGNOSIS — Z794 Long term (current) use of insulin: Secondary | ICD-10-CM | POA: Diagnosis not present

## 2019-03-30 DIAGNOSIS — I509 Heart failure, unspecified: Secondary | ICD-10-CM | POA: Diagnosis not present

## 2019-03-30 DIAGNOSIS — E119 Type 2 diabetes mellitus without complications: Secondary | ICD-10-CM | POA: Diagnosis not present

## 2019-03-30 DIAGNOSIS — Z79899 Other long term (current) drug therapy: Secondary | ICD-10-CM | POA: Diagnosis not present

## 2019-03-30 DIAGNOSIS — R197 Diarrhea, unspecified: Secondary | ICD-10-CM

## 2019-03-30 LAB — LIPASE, BLOOD: Lipase: 29 U/L (ref 11–51)

## 2019-03-30 LAB — CBC
HCT: 31.5 % — ABNORMAL LOW (ref 36.0–46.0)
Hemoglobin: 10.1 g/dL — ABNORMAL LOW (ref 12.0–15.0)
MCH: 29.6 pg (ref 26.0–34.0)
MCHC: 32.1 g/dL (ref 30.0–36.0)
MCV: 92.4 fL (ref 80.0–100.0)
Platelets: 733 10*3/uL — ABNORMAL HIGH (ref 150–400)
RBC: 3.41 MIL/uL — ABNORMAL LOW (ref 3.87–5.11)
RDW: 13.4 % (ref 11.5–15.5)
WBC: 19.1 10*3/uL — ABNORMAL HIGH (ref 4.0–10.5)
nRBC: 0 % (ref 0.0–0.2)

## 2019-03-30 LAB — COMPREHENSIVE METABOLIC PANEL
ALT: 14 U/L (ref 0–44)
AST: 21 U/L (ref 15–41)
Albumin: 3.8 g/dL (ref 3.5–5.0)
Alkaline Phosphatase: 70 U/L (ref 38–126)
Anion gap: 14 (ref 5–15)
BUN: 41 mg/dL — ABNORMAL HIGH (ref 8–23)
CO2: 22 mmol/L (ref 22–32)
Calcium: 8.8 mg/dL — ABNORMAL LOW (ref 8.9–10.3)
Chloride: 100 mmol/L (ref 98–111)
Creatinine, Ser: 1.72 mg/dL — ABNORMAL HIGH (ref 0.44–1.00)
GFR calc Af Amer: 34 mL/min — ABNORMAL LOW (ref >60)
GFR calc non Af Amer: 29 mL/min — ABNORMAL LOW (ref >60)
Glucose, Bld: 274 mg/dL — ABNORMAL HIGH (ref 70–99)
Potassium: 3.9 mmol/L (ref 3.5–5.1)
Sodium: 136 mmol/L (ref 135–145)
Total Bilirubin: 0.6 mg/dL (ref 0.3–1.2)
Total Protein: 8 g/dL (ref 6.5–8.1)

## 2019-03-30 MED ORDER — ONDANSETRON HCL 4 MG/2ML IJ SOLN
4.0000 mg | Freq: Once | INTRAMUSCULAR | Status: AC
  Administered 2019-03-30: 4 mg via INTRAVENOUS
  Filled 2019-03-30: qty 2

## 2019-03-30 MED ORDER — SODIUM CHLORIDE 0.9 % IV BOLUS
500.0000 mL | Freq: Once | INTRAVENOUS | Status: AC
  Administered 2019-03-30: 500 mL via INTRAVENOUS

## 2019-03-30 MED ORDER — SODIUM CHLORIDE 0.9% FLUSH
3.0000 mL | Freq: Once | INTRAVENOUS | Status: AC
  Administered 2019-03-30: 3 mL via INTRAVENOUS

## 2019-03-30 MED ORDER — ONDANSETRON HCL 4 MG PO TABS: 4.0000 mg | ORAL_TABLET | Freq: Three times a day (TID) | ORAL | 0 refills | Status: AC | PRN

## 2019-03-30 NOTE — ED Notes (Signed)
Pt tolerated diet ginger ale and crackers. No n/v/d or abd pain noted.

## 2019-03-30 NOTE — Discharge Instructions (Signed)
You were seen in the emergency department for nausea vomiting and some diarrhea after eating out at a restaurant.  Your lab work showed that your infection cell count was elevated and this is likely a stress reaction to all the vomiting you were doing.  Your numbers also look like you were dehydrated.  You received some fluids here along with some nausea medication and were able to eat and drink.  Please continue to push fluids.  We are sending a prescription for some nausea medication to your pharmacy if needed.  Return to the emergency department if any worsening or concerning symptoms.

## 2019-03-30 NOTE — ED Triage Notes (Signed)
Patient here from home brought in by son with complaints of nausea vomiting after eating at restaurant last night. "I think I have food poisoning". Denies pain.

## 2019-03-30 NOTE — ED Provider Notes (Signed)
[facility]-EMERGENCY DEPT Provider Note   CSN: [id number] Arrival date & time: 03/30/19  1030     History Chief Complaint  Patient presents with  . Nausea  . Emesis    Barbara Greer is a 74 y.o. female.  She is here with a complaint of nausea and vomiting.  She said she waited with family to the [place] for her birthday dinner.  When she returned home she began to feel nauseous and vomited all of her meal to the toilet.  She is vomited another 10 times since then bilious.  Remains nauseous but last episode of vomiting was 9 hours ago.  Feeling weak.  1 episode of diarrhea.  No abdominal pain.  No fevers or chills chest pain shortness of breath.  No urinary symptoms.  The history is provided by the patient.  Emesis Severity:  Severe Duration:  5 hours Timing:  Intermittent Number of daily episodes:  10 Quality:  Stomach contents and bilious material Progression:  Improving Chronicity:  New Recent urination:  Normal Relieved by:  None tried Worsened by:  Nothing Ineffective treatments:  None tried Associated symptoms: diarrhea   Associated symptoms: no abdominal pain, no chills, no cough, no fever and no sore throat   Risk factors: suspect food intake        Past Medical History:  Diagnosis Date  . Anemia   . Diabetes mellitus without complication ([facility])   . Hypertension     Patient Active Problem List   Diagnosis Date Noted  . DIABETES MELLITUS, TYPE II 11/13/2005  . HYPERLIPIDEMIA 11/13/2005  . ANEMIA, NORMOCYTIC 11/13/2005  . THROMBOCYTOSIS 11/13/2005  . TOBACCO USER 11/13/2005  . SYNDROME, CARPAL TUNNEL 11/13/2005  . HYPERTENSION 11/13/2005  . CONGESTIVE HEART FAILURE 11/13/2005  . POSTMENOPAUSAL STATUS 11/13/2005  . COCAINE ABUSE, HX OF 11/13/2005    Past Surgical History:  Procedure Laterality Date  . BACK SURGERY       OB History   No obstetric history on file.     No family history on file.  Social History   Tobacco Use   . Smoking status: Current Every Day Smoker    Packs/day: 0.50    Types: Cigarettes  . Smokeless tobacco: Never Used  Substance Use Topics  . Alcohol use: No  . Drug use: No    Home Medications Prior to Admission medications   Medication Sig Start Date End Date Taking? Authorizing Provider  albuterol (VENTOLIN HFA) 108 (90 Base) MCG/ACT inhaler Inhale 2 puffs into the lungs every 4 (four) hours as needed for wheezing.  05/31/17   [provider]  amoxicillin (AMOXIL) 500 MG capsule Take 500 mg by mouth 3 (three) times daily.  11/12/17   [provider]  aspirin EC 81 MG tablet Take 81 mg by mouth daily.    [provider]  atorvastatin (LIPITOR) 10 MG tablet Take 10 mg by mouth daily.  06/22/18   [provider]  cetirizine (ZYRTEC) 10 MG tablet Take 10 mg by mouth daily.  03/20/18 03/20/19  [provider]  gabapentin (NEURONTIN) 300 MG capsule Take 600 mg by mouth at bedtime.  01/22/18   [provider]  HYDROcodone-acetaminophen (NORCO/VICODIN) 5-325 MG tablet Take 1-2 tablets by mouth every 6 (six) hours as needed. Patient not taking: Reported on 08/02/2018 09/21/16   Vanetta Mulders, MD  Insulin Lispro Prot & Lispro (HUMALOG 75/25 MIX) (75-25) 100 UNIT/ML Kwikpen Inject 50 Units into the skin 2 (two) times a  day.  07/28/18   [provider]  lisinopril-hydrochlorothiazide (PRINZIDE,ZESTORETIC) 20-12.5 MG per tablet Take 1 tablet by mouth 2 (two) times daily. 03/03/12   [provider]  metFORMIN (GLUCOPHAGE) 850 MG tablet Take 850 mg by mouth 2 (two) times daily with a meal.    [provider]  Multiple Vitamin (MULTIVITAMIN WITH MINERALS) TABS tablet Take 1 tablet by mouth daily.    [provider]  omeprazole (PRILOSEC) 40 MG capsule Take 1 capsule (40 mg total) by mouth daily as needed (Heart burn). 08/02/18   Antony Madura, PA-C  ondansetron (ZOFRAN ODT) 4 MG disintegrating tablet Take 1 tablet (4 mg  total) by mouth every 8 (eight) hours as needed for nausea or vomiting. 08/02/18   Antony Madura, PA-C  Polyethyl Glycol-Propyl Glycol (SYSTANE ULTRA) 0.4-0.3 % SOLN Apply 1 drop to eye 3 (three) times daily as needed (for irritation/spots due to sun exposure.).     [provider]    Allergies    Oxycodone and Tramadol  Review of Systems   Review of Systems  Constitutional: Positive for fatigue. Negative for chills and fever.  HENT: Negative for sore throat.   Eyes: Negative for visual disturbance.  Respiratory: Negative for cough and shortness of breath.   Cardiovascular: Negative for chest pain.  Gastrointestinal: Positive for diarrhea, nausea and vomiting. Negative for abdominal pain and blood in stool.  Genitourinary: Negative for dysuria.  Musculoskeletal: Negative for neck pain.  Skin: Negative for rash.  Neurological: Negative for syncope.    Physical Exam Updated Vital Signs BP (!) 144/73 (BP Location: Right Arm)   Pulse (!) 103   Temp 98.7 F (37.1 C) (Oral)   Resp 18   Ht 5\' 2"  (1.575 m)   Wt 85.7 kg   SpO2 98%   BMI 34.57 kg/m   Physical Exam Vitals and nursing note reviewed.  Constitutional:      General: She is not in acute distress.    Appearance: She is well-developed.  HENT:     Head: Normocephalic and atraumatic.  Eyes:     Conjunctiva/sclera: Conjunctivae normal.  Cardiovascular:     Rate and Rhythm: Regular rhythm. Tachycardia present.     Pulses: Normal pulses.     Heart sounds: No murmur.  Pulmonary:     Effort: Pulmonary effort is normal. No respiratory distress.     Breath sounds: Normal breath sounds.  Abdominal:     Palpations: Abdomen is soft.     Tenderness: There is no abdominal tenderness. There is no guarding or rebound.  Musculoskeletal:        General: No deformity or signs of injury. Normal range of motion.     Cervical back: Neck supple.  Skin:    General: Skin is warm and dry.     Capillary Refill: Capillary refill  takes less than 2 seconds.  Neurological:     General: No focal deficit present.     Mental Status: She is alert.     ED Results / Procedures / Treatments   Labs (all labs ordered are listed, but only abnormal results are displayed) Labs Reviewed  COMPREHENSIVE METABOLIC PANEL - Abnormal; Notable for the following components:      Result Value   Glucose, Bld 274 (*)    BUN 41 (*)    Creatinine, Ser 1.72 (*)    Calcium 8.8 (*)    GFR calc non Af Amer 29 (*)    GFR calc Af Amer 34 (*)  All other components within normal limits  CBC - Abnormal; Notable for the following components:   WBC 19.1 (*)    RBC 3.41 (*)    Hemoglobin 10.1 (*)    HCT 31.5 (*)    Platelets 733 (*)    All other components within normal limits  LIPASE, BLOOD  URINALYSIS, ROUTINE W REFLEX MICROSCOPIC    EKG None  Radiology No results found.  Procedures Procedures (including critical care time)  Medications Ordered in ED Medications  sodium chloride flush (NS) 0.9 % injection 3 mL (3 mLs Intravenous Given 03/30/19 1111)  sodium chloride 0.9 % bolus 500 mL (0 mLs Intravenous Stopped 03/30/19 1200)  ondansetron (ZOFRAN) injection 4 mg (4 mg Intravenous Given 03/30/19 1107)    ED Course  I have reviewed the triage vital signs and the nursing notes.  Pertinent labs & imaging results that were available during my care of the patient were reviewed by me and considered in my medical decision making (see chart for details).  Clinical Course as of Mar 30 1911  Mon Mar 30, 2019  1055 Differential includes food poisoning, obstruction, gastritis, metabolic derangement   [MB]  1138 Blood cell count elevated at 19.1.  Hemoglobin low at 10 but at baseline.  Platelets elevated 733, higher than her baseline but runs chronically high.   [MB]  1287 Chemistry showing an elevated creatinine of 1.72 which is higher than her baseline of 1.3.   [MB]  1327 Evaluate-patient now tolerating fluids and feeling improved.   We will try her with some crackers.   [MB]    Clinical Course User Index [MB] Hayden Rasmussen, MD   MDM Rules/Calculators/A&P                       Final Clinical Impression(s) / ED Diagnoses Final diagnoses:  Nausea vomiting and diarrhea    Rx / DC Orders ED Discharge Orders         Ordered    ondansetron (ZOFRAN) 4 MG tablet  Every 8 hours PRN     03/30/19 1353           Hayden Rasmussen, MD 03/30/19 1914

## 2020-11-25 IMAGING — US ULTRASOUND ABDOMEN LIMITED
1 series · 14 of 25 positions shown · non-contrast
Comparison: None.

CLINICAL DATA: Epigastric pain

EXAM:
ULTRASOUND ABDOMEN LIMITED RIGHT UPPER QUADRANT

[Series 1: ultrasound abdomen limited · 14 of 44 slices shown]
[im 1/44]
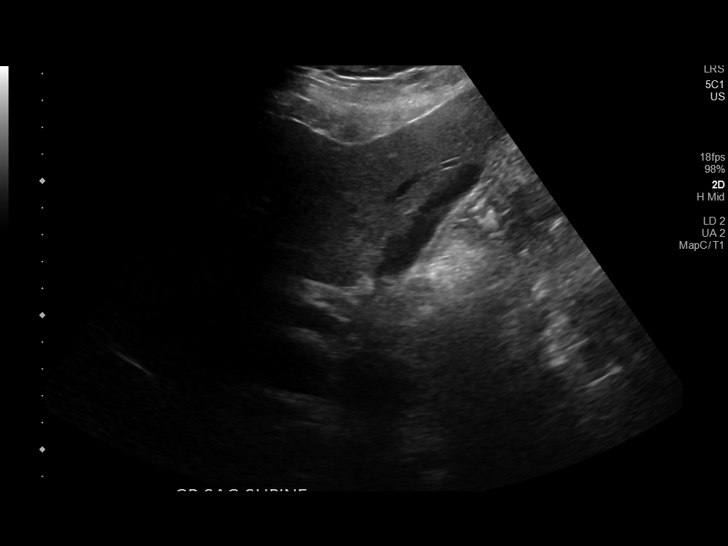
[im 4/44]
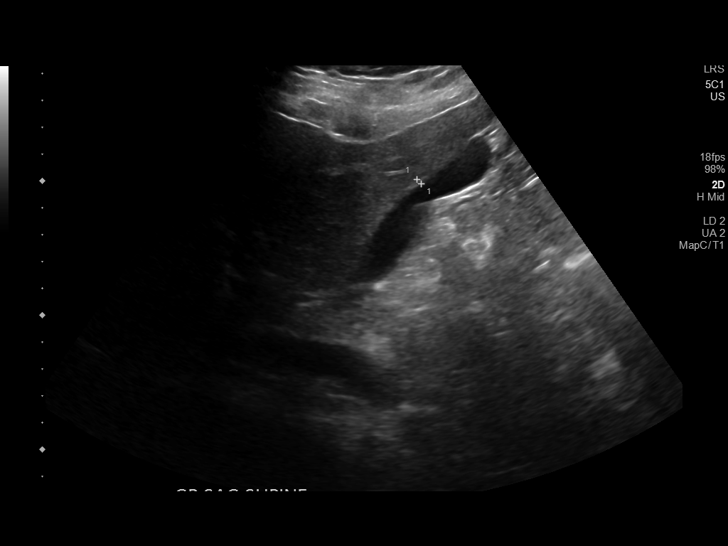
[im 8/44]
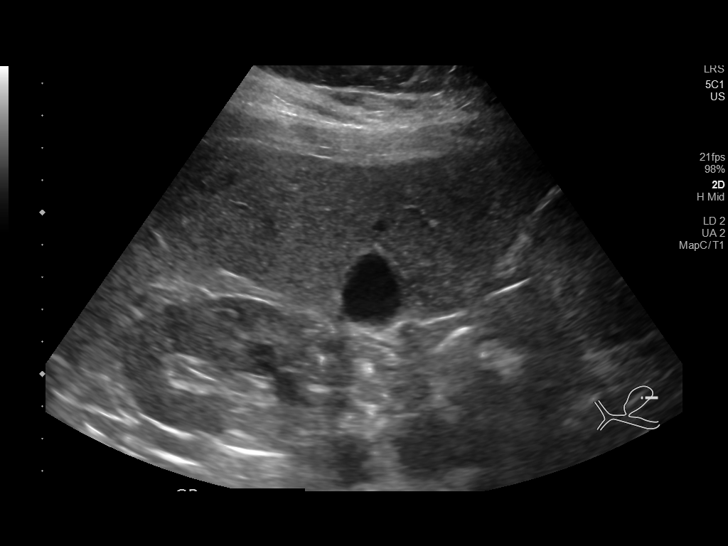
[im 11/44]
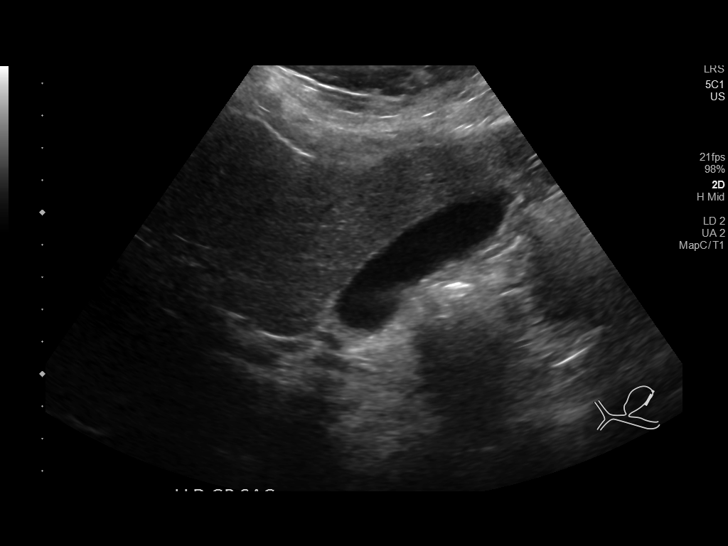
[im 15/44]
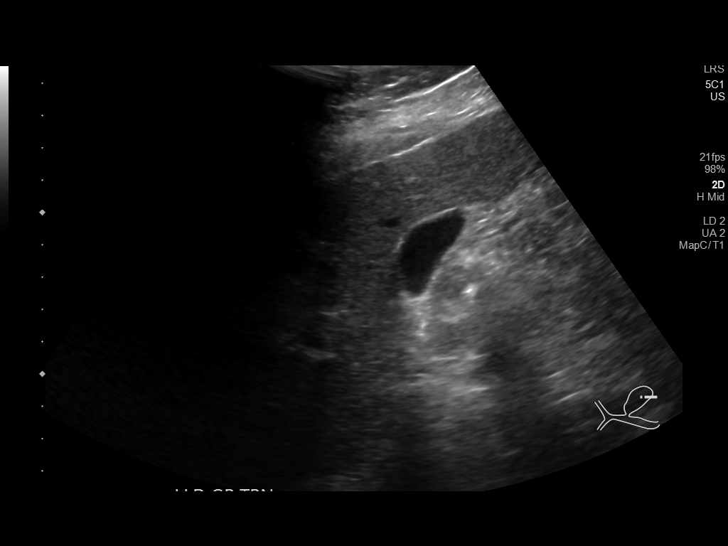
[im 17/44]
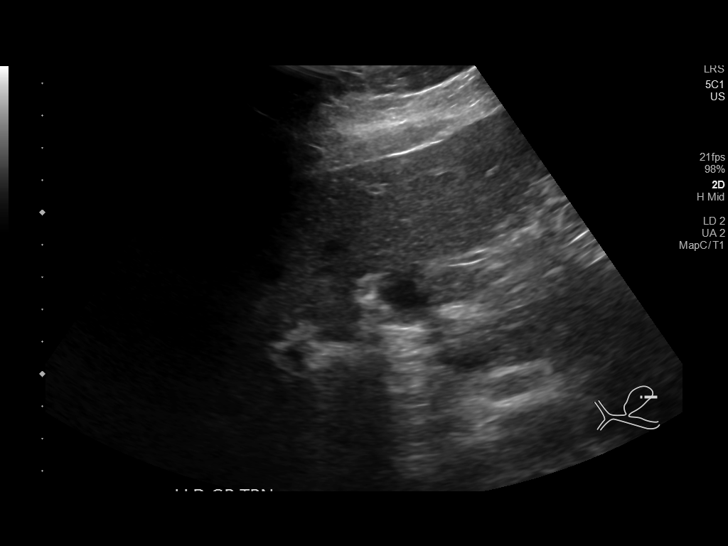
[im 20/44]
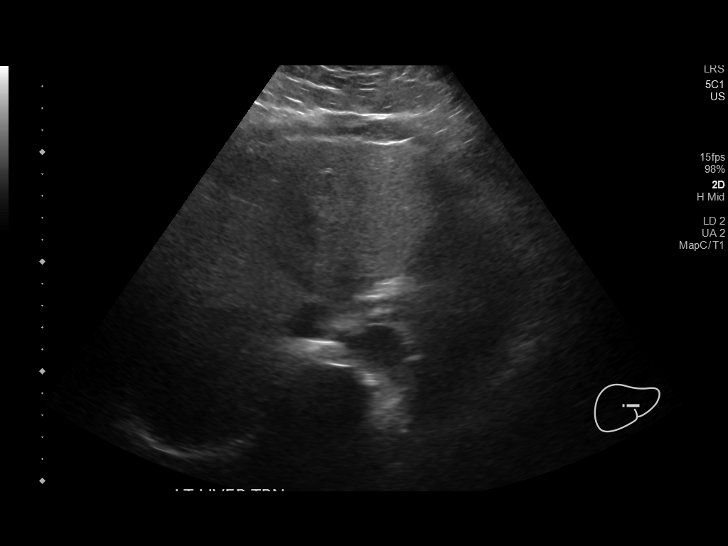
[im 24/44]
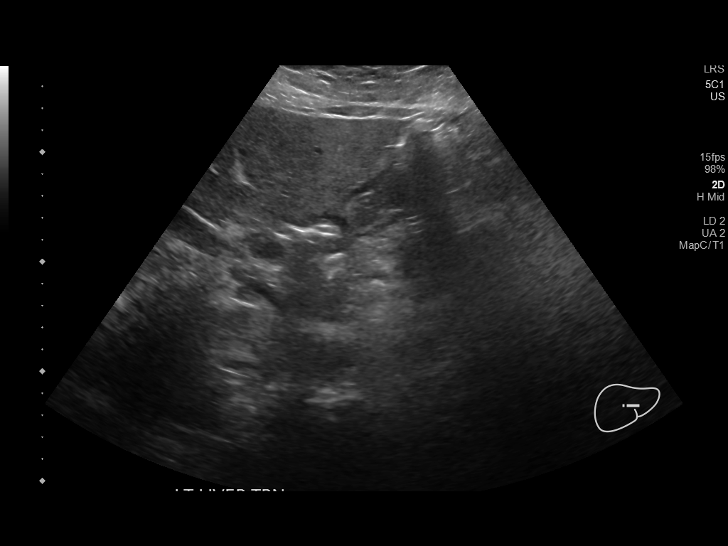
[im 27/44]
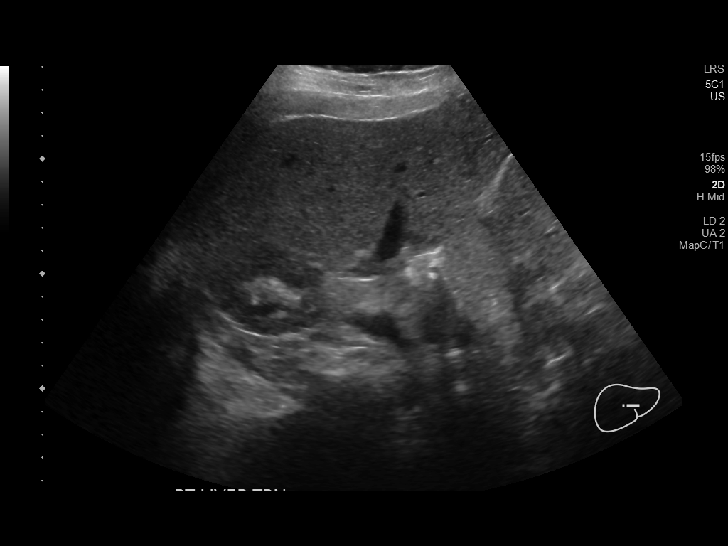
[im 29/44]
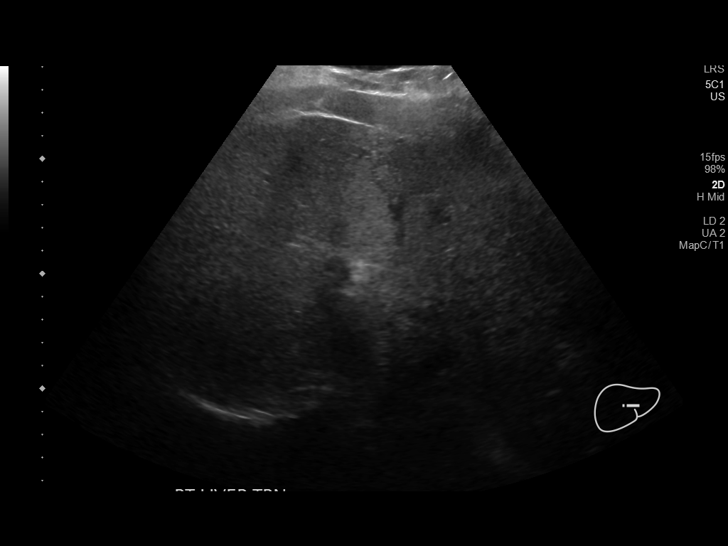
[im 33/44]
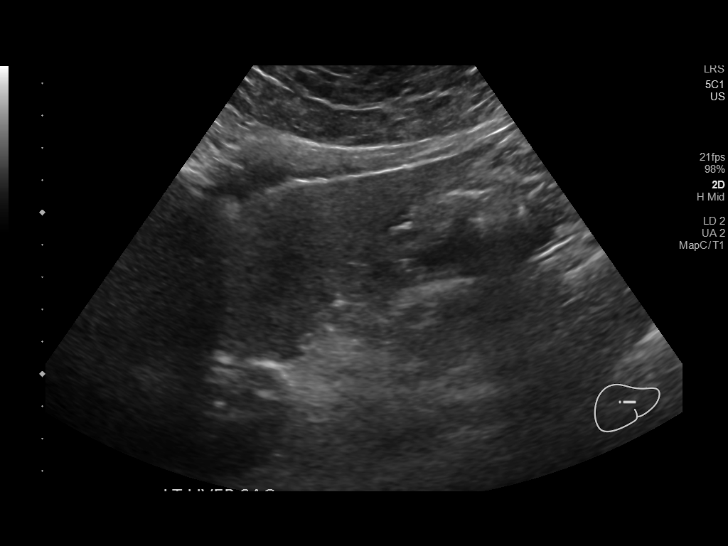
[im 36/44]
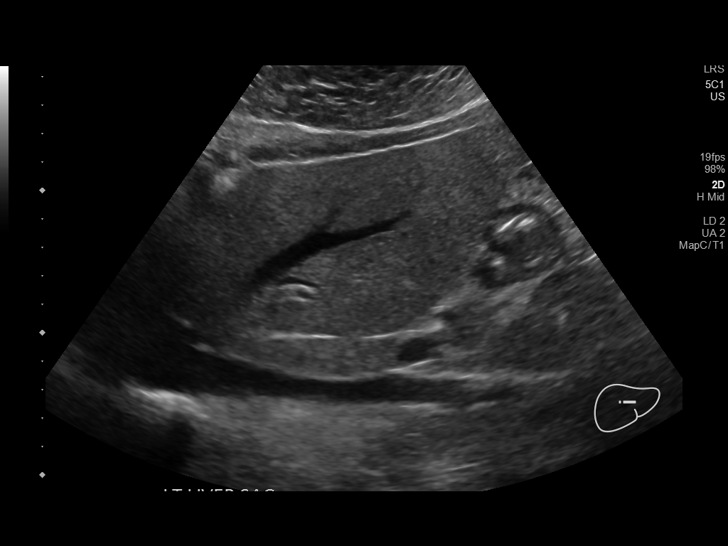
[im 40/44]
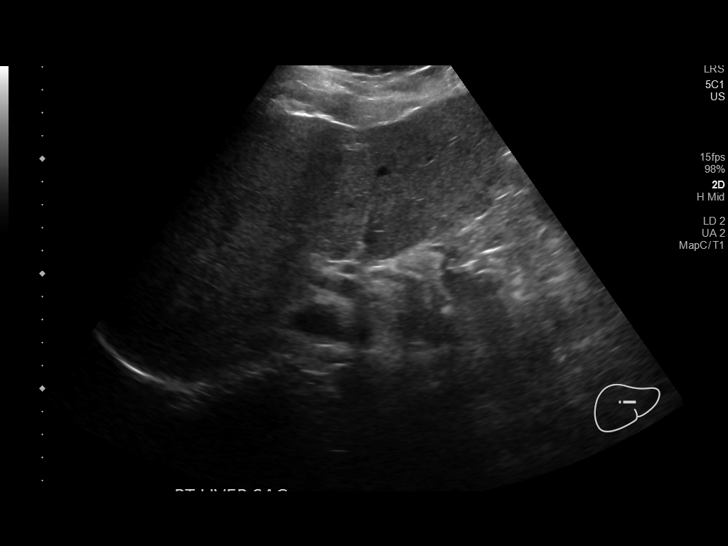
[im 44/44]
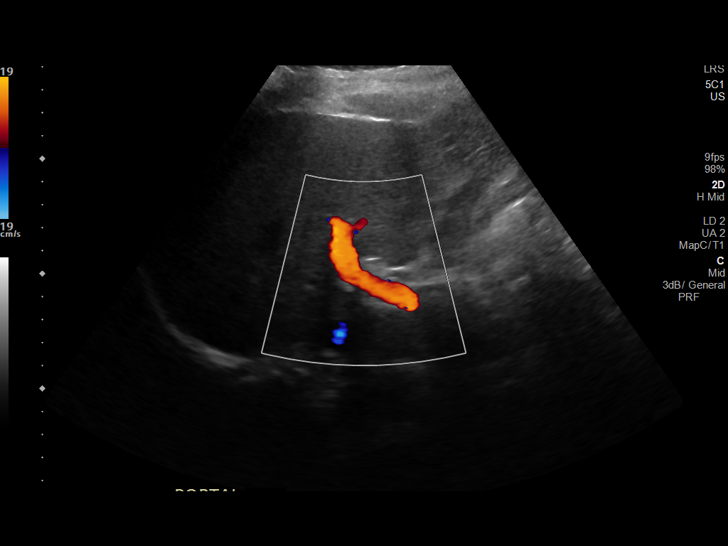

[14 of 25 positions shown; findings below may reference images not displayed]

FINDINGS: Gallbladder:

No gallstones or wall thickening visualized. No sonographic Murphy
sign noted by sonographer.

Common bile duct:

Diameter: 0.3 cm

Liver:

Diffuse increased echogenicity with slightly heterogeneous liver.
Appearance typically secondary to fatty infiltration. Fibrosis
secondary consideration. No secondary findings of cirrhosis noted.
No focal hepatic lesion or intrahepatic biliary duct dilatation.
Portal vein is patent on color Doppler imaging with normal direction
of blood flow towards the liver.
IMPRESSION: 1. No acute sonographic abnormality.
2. Hepatic steatosis.
